# Patient Record
Sex: Female | Born: 1945
Health system: Southern US, Community
[De-identification: ages and names within clinical notes are randomized; demographics above are authoritative.]

## PROBLEM LIST (undated history)

## (undated) DIAGNOSIS — J449 Chronic obstructive pulmonary disease, unspecified: Secondary | ICD-10-CM

## (undated) DIAGNOSIS — I10 Essential (primary) hypertension: Secondary | ICD-10-CM

## (undated) DIAGNOSIS — M1990 Unspecified osteoarthritis, unspecified site: Secondary | ICD-10-CM

## (undated) DIAGNOSIS — J45909 Unspecified asthma, uncomplicated: Secondary | ICD-10-CM

## (undated) HISTORY — PX: ABDOMINAL HYSTERECTOMY: SHX81

## (undated) HISTORY — PX: REPLACEMENT TOTAL KNEE: SUR1224

---

## 2014-05-15 DIAGNOSIS — M19011 Primary osteoarthritis, right shoulder: Secondary | ICD-10-CM | POA: Diagnosis not present

## 2014-05-30 DIAGNOSIS — Z1231 Encounter for screening mammogram for malignant neoplasm of breast: Secondary | ICD-10-CM | POA: Diagnosis not present

## 2014-06-13 DIAGNOSIS — J449 Chronic obstructive pulmonary disease, unspecified: Secondary | ICD-10-CM | POA: Diagnosis not present

## 2014-06-13 DIAGNOSIS — Z6825 Body mass index (BMI) 25.0-25.9, adult: Secondary | ICD-10-CM | POA: Diagnosis not present

## 2014-06-13 DIAGNOSIS — J208 Acute bronchitis due to other specified organisms: Secondary | ICD-10-CM | POA: Diagnosis not present

## 2014-06-13 DIAGNOSIS — I10 Essential (primary) hypertension: Secondary | ICD-10-CM | POA: Diagnosis not present

## 2014-06-13 DIAGNOSIS — J019 Acute sinusitis, unspecified: Secondary | ICD-10-CM | POA: Diagnosis not present

## 2014-06-23 DIAGNOSIS — Z9181 History of falling: Secondary | ICD-10-CM | POA: Diagnosis not present

## 2014-06-23 DIAGNOSIS — Z1389 Encounter for screening for other disorder: Secondary | ICD-10-CM | POA: Diagnosis not present

## 2014-06-23 DIAGNOSIS — I471 Supraventricular tachycardia: Secondary | ICD-10-CM | POA: Diagnosis not present

## 2014-06-23 DIAGNOSIS — J449 Chronic obstructive pulmonary disease, unspecified: Secondary | ICD-10-CM | POA: Diagnosis not present

## 2014-06-23 DIAGNOSIS — M19011 Primary osteoarthritis, right shoulder: Secondary | ICD-10-CM | POA: Diagnosis not present

## 2014-06-23 DIAGNOSIS — I10 Essential (primary) hypertension: Secondary | ICD-10-CM | POA: Diagnosis not present

## 2014-06-26 DIAGNOSIS — Z01818 Encounter for other preprocedural examination: Secondary | ICD-10-CM | POA: Diagnosis not present

## 2014-06-26 DIAGNOSIS — M79609 Pain in unspecified limb: Secondary | ICD-10-CM | POA: Diagnosis not present

## 2014-06-26 DIAGNOSIS — Z79899 Other long term (current) drug therapy: Secondary | ICD-10-CM | POA: Diagnosis not present

## 2014-07-03 DIAGNOSIS — M19011 Primary osteoarthritis, right shoulder: Secondary | ICD-10-CM | POA: Diagnosis not present

## 2014-07-13 DIAGNOSIS — M25511 Pain in right shoulder: Secondary | ICD-10-CM | POA: Diagnosis not present

## 2014-07-13 DIAGNOSIS — M879 Osteonecrosis, unspecified: Secondary | ICD-10-CM | POA: Diagnosis not present

## 2014-07-13 DIAGNOSIS — M19011 Primary osteoarthritis, right shoulder: Secondary | ICD-10-CM | POA: Diagnosis not present

## 2014-07-27 DIAGNOSIS — Z880 Allergy status to penicillin: Secondary | ICD-10-CM | POA: Diagnosis not present

## 2014-07-27 DIAGNOSIS — Z87311 Personal history of (healed) other pathological fracture: Secondary | ICD-10-CM | POA: Diagnosis not present

## 2014-07-27 DIAGNOSIS — Z8679 Personal history of other diseases of the circulatory system: Secondary | ICD-10-CM | POA: Diagnosis not present

## 2014-07-27 DIAGNOSIS — Z8261 Family history of arthritis: Secondary | ICD-10-CM | POA: Diagnosis not present

## 2014-07-27 DIAGNOSIS — Z91013 Allergy to seafood: Secondary | ICD-10-CM | POA: Diagnosis not present

## 2014-07-27 DIAGNOSIS — Z79899 Other long term (current) drug therapy: Secondary | ICD-10-CM | POA: Diagnosis not present

## 2014-07-27 DIAGNOSIS — M8580 Other specified disorders of bone density and structure, unspecified site: Secondary | ICD-10-CM | POA: Diagnosis not present

## 2014-07-27 DIAGNOSIS — F419 Anxiety disorder, unspecified: Secondary | ICD-10-CM | POA: Diagnosis not present

## 2014-07-27 DIAGNOSIS — G8918 Other acute postprocedural pain: Secondary | ICD-10-CM | POA: Diagnosis not present

## 2014-07-27 DIAGNOSIS — Z96652 Presence of left artificial knee joint: Secondary | ICD-10-CM | POA: Diagnosis not present

## 2014-07-27 DIAGNOSIS — Z96611 Presence of right artificial shoulder joint: Secondary | ICD-10-CM | POA: Diagnosis not present

## 2014-07-27 DIAGNOSIS — E780 Pure hypercholesterolemia: Secondary | ICD-10-CM | POA: Diagnosis not present

## 2014-07-27 DIAGNOSIS — D649 Anemia, unspecified: Secondary | ICD-10-CM | POA: Diagnosis not present

## 2014-07-27 DIAGNOSIS — Z87442 Personal history of urinary calculi: Secondary | ICD-10-CM | POA: Diagnosis not present

## 2014-07-27 DIAGNOSIS — M19011 Primary osteoarthritis, right shoulder: Secondary | ICD-10-CM | POA: Diagnosis not present

## 2014-07-27 DIAGNOSIS — Z472 Encounter for removal of internal fixation device: Secondary | ICD-10-CM | POA: Diagnosis not present

## 2014-07-27 DIAGNOSIS — Z87898 Personal history of other specified conditions: Secondary | ICD-10-CM | POA: Diagnosis not present

## 2014-07-27 DIAGNOSIS — Z471 Aftercare following joint replacement surgery: Secondary | ICD-10-CM | POA: Diagnosis not present

## 2014-07-27 DIAGNOSIS — Z809 Family history of malignant neoplasm, unspecified: Secondary | ICD-10-CM | POA: Diagnosis not present

## 2014-07-27 DIAGNOSIS — Z885 Allergy status to narcotic agent status: Secondary | ICD-10-CM | POA: Diagnosis not present

## 2014-07-27 DIAGNOSIS — Z882 Allergy status to sulfonamides status: Secondary | ICD-10-CM | POA: Diagnosis not present

## 2014-07-27 DIAGNOSIS — Z886 Allergy status to analgesic agent status: Secondary | ICD-10-CM | POA: Diagnosis not present

## 2014-07-27 DIAGNOSIS — Z90710 Acquired absence of both cervix and uterus: Secondary | ICD-10-CM | POA: Diagnosis not present

## 2014-07-27 DIAGNOSIS — J45909 Unspecified asthma, uncomplicated: Secondary | ICD-10-CM | POA: Diagnosis not present

## 2014-07-27 DIAGNOSIS — K219 Gastro-esophageal reflux disease without esophagitis: Secondary | ICD-10-CM | POA: Diagnosis not present

## 2014-07-27 DIAGNOSIS — J449 Chronic obstructive pulmonary disease, unspecified: Secondary | ICD-10-CM | POA: Diagnosis not present

## 2014-07-27 DIAGNOSIS — F329 Major depressive disorder, single episode, unspecified: Secondary | ICD-10-CM | POA: Diagnosis not present

## 2014-07-27 DIAGNOSIS — Z9889 Other specified postprocedural states: Secondary | ICD-10-CM | POA: Diagnosis not present

## 2014-07-27 DIAGNOSIS — Z960 Presence of urogenital implants: Secondary | ICD-10-CM | POA: Diagnosis not present

## 2014-07-30 DIAGNOSIS — Z471 Aftercare following joint replacement surgery: Secondary | ICD-10-CM | POA: Diagnosis not present

## 2014-07-30 DIAGNOSIS — M25511 Pain in right shoulder: Secondary | ICD-10-CM | POA: Diagnosis not present

## 2014-07-30 DIAGNOSIS — Z96611 Presence of right artificial shoulder joint: Secondary | ICD-10-CM | POA: Diagnosis not present

## 2014-08-01 DIAGNOSIS — Z96611 Presence of right artificial shoulder joint: Secondary | ICD-10-CM | POA: Diagnosis not present

## 2014-08-01 DIAGNOSIS — Z471 Aftercare following joint replacement surgery: Secondary | ICD-10-CM | POA: Diagnosis not present

## 2014-08-01 DIAGNOSIS — M25511 Pain in right shoulder: Secondary | ICD-10-CM | POA: Diagnosis not present

## 2014-08-03 DIAGNOSIS — Z471 Aftercare following joint replacement surgery: Secondary | ICD-10-CM | POA: Diagnosis not present

## 2014-08-03 DIAGNOSIS — M25511 Pain in right shoulder: Secondary | ICD-10-CM | POA: Diagnosis not present

## 2014-08-03 DIAGNOSIS — Z96611 Presence of right artificial shoulder joint: Secondary | ICD-10-CM | POA: Diagnosis not present

## 2014-08-07 DIAGNOSIS — Z96611 Presence of right artificial shoulder joint: Secondary | ICD-10-CM | POA: Diagnosis not present

## 2014-08-07 DIAGNOSIS — M25511 Pain in right shoulder: Secondary | ICD-10-CM | POA: Diagnosis not present

## 2014-08-07 DIAGNOSIS — Z471 Aftercare following joint replacement surgery: Secondary | ICD-10-CM | POA: Diagnosis not present

## 2014-08-09 DIAGNOSIS — Z471 Aftercare following joint replacement surgery: Secondary | ICD-10-CM | POA: Diagnosis not present

## 2014-08-09 DIAGNOSIS — M25511 Pain in right shoulder: Secondary | ICD-10-CM | POA: Diagnosis not present

## 2014-08-09 DIAGNOSIS — Z96611 Presence of right artificial shoulder joint: Secondary | ICD-10-CM | POA: Diagnosis not present

## 2014-08-11 DIAGNOSIS — M25511 Pain in right shoulder: Secondary | ICD-10-CM | POA: Diagnosis not present

## 2014-08-11 DIAGNOSIS — I10 Essential (primary) hypertension: Secondary | ICD-10-CM | POA: Diagnosis not present

## 2014-08-11 DIAGNOSIS — Z96611 Presence of right artificial shoulder joint: Secondary | ICD-10-CM | POA: Diagnosis not present

## 2014-08-11 DIAGNOSIS — D62 Acute posthemorrhagic anemia: Secondary | ICD-10-CM | POA: Diagnosis not present

## 2014-08-11 DIAGNOSIS — Z6825 Body mass index (BMI) 25.0-25.9, adult: Secondary | ICD-10-CM | POA: Diagnosis not present

## 2014-08-11 DIAGNOSIS — Z471 Aftercare following joint replacement surgery: Secondary | ICD-10-CM | POA: Diagnosis not present

## 2014-08-18 DIAGNOSIS — Z96611 Presence of right artificial shoulder joint: Secondary | ICD-10-CM | POA: Diagnosis not present

## 2014-08-18 DIAGNOSIS — M25511 Pain in right shoulder: Secondary | ICD-10-CM | POA: Diagnosis not present

## 2014-08-18 DIAGNOSIS — M25611 Stiffness of right shoulder, not elsewhere classified: Secondary | ICD-10-CM | POA: Diagnosis not present

## 2014-08-18 DIAGNOSIS — M6281 Muscle weakness (generalized): Secondary | ICD-10-CM | POA: Diagnosis not present

## 2014-08-22 DIAGNOSIS — M25511 Pain in right shoulder: Secondary | ICD-10-CM | POA: Diagnosis not present

## 2014-08-22 DIAGNOSIS — M6281 Muscle weakness (generalized): Secondary | ICD-10-CM | POA: Diagnosis not present

## 2014-08-22 DIAGNOSIS — Z96611 Presence of right artificial shoulder joint: Secondary | ICD-10-CM | POA: Diagnosis not present

## 2014-08-22 DIAGNOSIS — M25611 Stiffness of right shoulder, not elsewhere classified: Secondary | ICD-10-CM | POA: Diagnosis not present

## 2014-08-24 DIAGNOSIS — M25611 Stiffness of right shoulder, not elsewhere classified: Secondary | ICD-10-CM | POA: Diagnosis not present

## 2014-08-24 DIAGNOSIS — M25511 Pain in right shoulder: Secondary | ICD-10-CM | POA: Diagnosis not present

## 2014-08-24 DIAGNOSIS — M6281 Muscle weakness (generalized): Secondary | ICD-10-CM | POA: Diagnosis not present

## 2014-08-24 DIAGNOSIS — Z96611 Presence of right artificial shoulder joint: Secondary | ICD-10-CM | POA: Diagnosis not present

## 2014-08-25 DIAGNOSIS — Z6825 Body mass index (BMI) 25.0-25.9, adult: Secondary | ICD-10-CM | POA: Diagnosis not present

## 2014-08-25 DIAGNOSIS — Z800 Family history of malignant neoplasm of digestive organs: Secondary | ICD-10-CM | POA: Diagnosis not present

## 2014-08-25 DIAGNOSIS — I10 Essential (primary) hypertension: Secondary | ICD-10-CM | POA: Diagnosis not present

## 2014-08-29 DIAGNOSIS — Z96611 Presence of right artificial shoulder joint: Secondary | ICD-10-CM | POA: Diagnosis not present

## 2014-08-29 DIAGNOSIS — M6281 Muscle weakness (generalized): Secondary | ICD-10-CM | POA: Diagnosis not present

## 2014-08-29 DIAGNOSIS — M25611 Stiffness of right shoulder, not elsewhere classified: Secondary | ICD-10-CM | POA: Diagnosis not present

## 2014-08-29 DIAGNOSIS — M25511 Pain in right shoulder: Secondary | ICD-10-CM | POA: Diagnosis not present

## 2014-08-31 DIAGNOSIS — M25611 Stiffness of right shoulder, not elsewhere classified: Secondary | ICD-10-CM | POA: Diagnosis not present

## 2014-08-31 DIAGNOSIS — M6281 Muscle weakness (generalized): Secondary | ICD-10-CM | POA: Diagnosis not present

## 2014-08-31 DIAGNOSIS — Z96611 Presence of right artificial shoulder joint: Secondary | ICD-10-CM | POA: Diagnosis not present

## 2014-08-31 DIAGNOSIS — M25511 Pain in right shoulder: Secondary | ICD-10-CM | POA: Diagnosis not present

## 2014-09-05 DIAGNOSIS — Z96611 Presence of right artificial shoulder joint: Secondary | ICD-10-CM | POA: Diagnosis not present

## 2014-09-05 DIAGNOSIS — M25611 Stiffness of right shoulder, not elsewhere classified: Secondary | ICD-10-CM | POA: Diagnosis not present

## 2014-09-05 DIAGNOSIS — M6281 Muscle weakness (generalized): Secondary | ICD-10-CM | POA: Diagnosis not present

## 2014-09-05 DIAGNOSIS — M25511 Pain in right shoulder: Secondary | ICD-10-CM | POA: Diagnosis not present

## 2014-09-07 DIAGNOSIS — M6281 Muscle weakness (generalized): Secondary | ICD-10-CM | POA: Diagnosis not present

## 2014-09-07 DIAGNOSIS — Z96611 Presence of right artificial shoulder joint: Secondary | ICD-10-CM | POA: Diagnosis not present

## 2014-09-07 DIAGNOSIS — M25611 Stiffness of right shoulder, not elsewhere classified: Secondary | ICD-10-CM | POA: Diagnosis not present

## 2014-09-07 DIAGNOSIS — M25511 Pain in right shoulder: Secondary | ICD-10-CM | POA: Diagnosis not present

## 2014-09-08 DIAGNOSIS — F419 Anxiety disorder, unspecified: Secondary | ICD-10-CM | POA: Diagnosis not present

## 2014-09-08 DIAGNOSIS — I10 Essential (primary) hypertension: Secondary | ICD-10-CM | POA: Diagnosis not present

## 2014-09-08 DIAGNOSIS — Z6825 Body mass index (BMI) 25.0-25.9, adult: Secondary | ICD-10-CM | POA: Diagnosis not present

## 2014-09-13 DIAGNOSIS — M25511 Pain in right shoulder: Secondary | ICD-10-CM | POA: Diagnosis not present

## 2014-09-13 DIAGNOSIS — M6281 Muscle weakness (generalized): Secondary | ICD-10-CM | POA: Diagnosis not present

## 2014-09-13 DIAGNOSIS — M25611 Stiffness of right shoulder, not elsewhere classified: Secondary | ICD-10-CM | POA: Diagnosis not present

## 2014-09-13 DIAGNOSIS — Z96611 Presence of right artificial shoulder joint: Secondary | ICD-10-CM | POA: Diagnosis not present

## 2014-09-15 DIAGNOSIS — M6281 Muscle weakness (generalized): Secondary | ICD-10-CM | POA: Diagnosis not present

## 2014-09-15 DIAGNOSIS — M25511 Pain in right shoulder: Secondary | ICD-10-CM | POA: Diagnosis not present

## 2014-09-15 DIAGNOSIS — M25611 Stiffness of right shoulder, not elsewhere classified: Secondary | ICD-10-CM | POA: Diagnosis not present

## 2014-09-15 DIAGNOSIS — Z96611 Presence of right artificial shoulder joint: Secondary | ICD-10-CM | POA: Diagnosis not present

## 2014-09-26 DIAGNOSIS — M6281 Muscle weakness (generalized): Secondary | ICD-10-CM | POA: Diagnosis not present

## 2014-09-26 DIAGNOSIS — M25511 Pain in right shoulder: Secondary | ICD-10-CM | POA: Diagnosis not present

## 2014-09-26 DIAGNOSIS — Z96611 Presence of right artificial shoulder joint: Secondary | ICD-10-CM | POA: Diagnosis not present

## 2014-09-26 DIAGNOSIS — M25611 Stiffness of right shoulder, not elsewhere classified: Secondary | ICD-10-CM | POA: Diagnosis not present

## 2014-10-03 DIAGNOSIS — Z96611 Presence of right artificial shoulder joint: Secondary | ICD-10-CM | POA: Diagnosis not present

## 2014-10-03 DIAGNOSIS — M25611 Stiffness of right shoulder, not elsewhere classified: Secondary | ICD-10-CM | POA: Diagnosis not present

## 2014-10-03 DIAGNOSIS — M25511 Pain in right shoulder: Secondary | ICD-10-CM | POA: Diagnosis not present

## 2014-10-03 DIAGNOSIS — M6281 Muscle weakness (generalized): Secondary | ICD-10-CM | POA: Diagnosis not present

## 2014-10-06 DIAGNOSIS — Z9181 History of falling: Secondary | ICD-10-CM | POA: Diagnosis not present

## 2014-10-06 DIAGNOSIS — Z6824 Body mass index (BMI) 24.0-24.9, adult: Secondary | ICD-10-CM | POA: Diagnosis not present

## 2014-10-06 DIAGNOSIS — I10 Essential (primary) hypertension: Secondary | ICD-10-CM | POA: Diagnosis not present

## 2014-10-06 DIAGNOSIS — Z1389 Encounter for screening for other disorder: Secondary | ICD-10-CM | POA: Diagnosis not present

## 2014-10-06 DIAGNOSIS — Z139 Encounter for screening, unspecified: Secondary | ICD-10-CM | POA: Diagnosis not present

## 2014-11-01 DIAGNOSIS — Z6824 Body mass index (BMI) 24.0-24.9, adult: Secondary | ICD-10-CM | POA: Diagnosis not present

## 2014-11-01 DIAGNOSIS — J019 Acute sinusitis, unspecified: Secondary | ICD-10-CM | POA: Diagnosis not present

## 2015-01-12 DIAGNOSIS — D62 Acute posthemorrhagic anemia: Secondary | ICD-10-CM | POA: Diagnosis not present

## 2015-01-12 DIAGNOSIS — Z1389 Encounter for screening for other disorder: Secondary | ICD-10-CM | POA: Diagnosis not present

## 2015-01-12 DIAGNOSIS — I10 Essential (primary) hypertension: Secondary | ICD-10-CM | POA: Diagnosis not present

## 2015-01-12 DIAGNOSIS — J449 Chronic obstructive pulmonary disease, unspecified: Secondary | ICD-10-CM | POA: Diagnosis not present

## 2015-01-12 DIAGNOSIS — E785 Hyperlipidemia, unspecified: Secondary | ICD-10-CM | POA: Diagnosis not present

## 2015-01-31 DIAGNOSIS — J019 Acute sinusitis, unspecified: Secondary | ICD-10-CM | POA: Diagnosis not present

## 2015-01-31 DIAGNOSIS — R110 Nausea: Secondary | ICD-10-CM | POA: Diagnosis not present

## 2015-01-31 DIAGNOSIS — H8113 Benign paroxysmal vertigo, bilateral: Secondary | ICD-10-CM | POA: Diagnosis not present

## 2015-02-02 DIAGNOSIS — M810 Age-related osteoporosis without current pathological fracture: Secondary | ICD-10-CM | POA: Diagnosis not present

## 2015-02-06 DIAGNOSIS — Z96611 Presence of right artificial shoulder joint: Secondary | ICD-10-CM | POA: Diagnosis not present

## 2015-03-06 DIAGNOSIS — M7612 Psoas tendinitis, left hip: Secondary | ICD-10-CM | POA: Diagnosis not present

## 2015-03-09 DIAGNOSIS — M7612 Psoas tendinitis, left hip: Secondary | ICD-10-CM | POA: Diagnosis not present

## 2015-03-09 DIAGNOSIS — M25552 Pain in left hip: Secondary | ICD-10-CM | POA: Diagnosis not present

## 2015-03-13 DIAGNOSIS — M25552 Pain in left hip: Secondary | ICD-10-CM | POA: Diagnosis not present

## 2015-03-13 DIAGNOSIS — M7612 Psoas tendinitis, left hip: Secondary | ICD-10-CM | POA: Diagnosis not present

## 2015-03-16 DIAGNOSIS — M7612 Psoas tendinitis, left hip: Secondary | ICD-10-CM | POA: Diagnosis not present

## 2015-03-16 DIAGNOSIS — M25552 Pain in left hip: Secondary | ICD-10-CM | POA: Diagnosis not present

## 2015-03-20 DIAGNOSIS — M7612 Psoas tendinitis, left hip: Secondary | ICD-10-CM | POA: Diagnosis not present

## 2015-03-20 DIAGNOSIS — M25552 Pain in left hip: Secondary | ICD-10-CM | POA: Diagnosis not present

## 2015-05-09 DIAGNOSIS — J019 Acute sinusitis, unspecified: Secondary | ICD-10-CM | POA: Diagnosis not present

## 2015-05-09 DIAGNOSIS — Z6825 Body mass index (BMI) 25.0-25.9, adult: Secondary | ICD-10-CM | POA: Diagnosis not present

## 2015-05-30 DIAGNOSIS — J208 Acute bronchitis due to other specified organisms: Secondary | ICD-10-CM | POA: Diagnosis not present

## 2015-06-04 DIAGNOSIS — J208 Acute bronchitis due to other specified organisms: Secondary | ICD-10-CM | POA: Diagnosis not present

## 2015-07-20 DIAGNOSIS — J019 Acute sinusitis, unspecified: Secondary | ICD-10-CM | POA: Diagnosis not present

## 2015-07-20 DIAGNOSIS — J449 Chronic obstructive pulmonary disease, unspecified: Secondary | ICD-10-CM | POA: Diagnosis not present

## 2015-07-20 DIAGNOSIS — I10 Essential (primary) hypertension: Secondary | ICD-10-CM | POA: Diagnosis not present

## 2015-07-20 DIAGNOSIS — E785 Hyperlipidemia, unspecified: Secondary | ICD-10-CM | POA: Diagnosis not present

## 2015-07-20 DIAGNOSIS — D62 Acute posthemorrhagic anemia: Secondary | ICD-10-CM | POA: Diagnosis not present

## 2015-07-27 DIAGNOSIS — Z1231 Encounter for screening mammogram for malignant neoplasm of breast: Secondary | ICD-10-CM | POA: Diagnosis not present

## 2015-11-07 DIAGNOSIS — E663 Overweight: Secondary | ICD-10-CM | POA: Diagnosis not present

## 2015-11-07 DIAGNOSIS — J019 Acute sinusitis, unspecified: Secondary | ICD-10-CM | POA: Diagnosis not present

## 2015-11-07 DIAGNOSIS — J208 Acute bronchitis due to other specified organisms: Secondary | ICD-10-CM | POA: Diagnosis not present

## 2015-11-07 DIAGNOSIS — H8110 Benign paroxysmal vertigo, unspecified ear: Secondary | ICD-10-CM | POA: Diagnosis not present

## 2015-11-15 DIAGNOSIS — E860 Dehydration: Secondary | ICD-10-CM | POA: Diagnosis not present

## 2015-11-15 DIAGNOSIS — R531 Weakness: Secondary | ICD-10-CM | POA: Diagnosis not present

## 2015-11-15 DIAGNOSIS — Z6824 Body mass index (BMI) 24.0-24.9, adult: Secondary | ICD-10-CM | POA: Diagnosis not present

## 2015-11-15 DIAGNOSIS — I471 Supraventricular tachycardia: Secondary | ICD-10-CM | POA: Diagnosis not present

## 2015-12-12 DIAGNOSIS — M5416 Radiculopathy, lumbar region: Secondary | ICD-10-CM | POA: Diagnosis not present

## 2015-12-14 DIAGNOSIS — M4806 Spinal stenosis, lumbar region: Secondary | ICD-10-CM | POA: Diagnosis not present

## 2015-12-14 DIAGNOSIS — M5416 Radiculopathy, lumbar region: Secondary | ICD-10-CM | POA: Diagnosis not present

## 2015-12-19 DIAGNOSIS — M5416 Radiculopathy, lumbar region: Secondary | ICD-10-CM | POA: Diagnosis not present

## 2015-12-21 DIAGNOSIS — M5136 Other intervertebral disc degeneration, lumbar region: Secondary | ICD-10-CM | POA: Diagnosis not present

## 2015-12-21 DIAGNOSIS — M5416 Radiculopathy, lumbar region: Secondary | ICD-10-CM | POA: Diagnosis not present

## 2016-01-28 DIAGNOSIS — J019 Acute sinusitis, unspecified: Secondary | ICD-10-CM | POA: Diagnosis not present

## 2016-02-07 DIAGNOSIS — Z9181 History of falling: Secondary | ICD-10-CM | POA: Diagnosis not present

## 2016-02-07 DIAGNOSIS — D62 Acute posthemorrhagic anemia: Secondary | ICD-10-CM | POA: Diagnosis not present

## 2016-02-07 DIAGNOSIS — Z1389 Encounter for screening for other disorder: Secondary | ICD-10-CM | POA: Diagnosis not present

## 2016-02-07 DIAGNOSIS — I10 Essential (primary) hypertension: Secondary | ICD-10-CM | POA: Diagnosis not present

## 2016-02-07 DIAGNOSIS — J449 Chronic obstructive pulmonary disease, unspecified: Secondary | ICD-10-CM | POA: Diagnosis not present

## 2016-02-07 DIAGNOSIS — E785 Hyperlipidemia, unspecified: Secondary | ICD-10-CM | POA: Diagnosis not present

## 2016-02-13 DIAGNOSIS — M79671 Pain in right foot: Secondary | ICD-10-CM | POA: Diagnosis not present

## 2016-02-13 DIAGNOSIS — M84374A Stress fracture, right foot, initial encounter for fracture: Secondary | ICD-10-CM | POA: Diagnosis not present

## 2016-03-11 DIAGNOSIS — Z23 Encounter for immunization: Secondary | ICD-10-CM | POA: Diagnosis not present

## 2016-03-19 DIAGNOSIS — M84374D Stress fracture, right foot, subsequent encounter for fracture with routine healing: Secondary | ICD-10-CM | POA: Diagnosis not present

## 2016-04-03 ENCOUNTER — Encounter (HOSPITAL_COMMUNITY): Payer: Self-pay | Admitting: Pharmacy Technician

## 2016-04-03 DIAGNOSIS — Z5329 Procedure and treatment not carried out because of patient's decision for other reasons: Secondary | ICD-10-CM | POA: Diagnosis not present

## 2016-04-03 DIAGNOSIS — Z96659 Presence of unspecified artificial knee joint: Secondary | ICD-10-CM | POA: Diagnosis not present

## 2016-04-03 DIAGNOSIS — J449 Chronic obstructive pulmonary disease, unspecified: Secondary | ICD-10-CM | POA: Insufficient documentation

## 2016-04-03 DIAGNOSIS — I10 Essential (primary) hypertension: Secondary | ICD-10-CM | POA: Diagnosis not present

## 2016-04-03 DIAGNOSIS — M5442 Lumbago with sciatica, left side: Secondary | ICD-10-CM | POA: Diagnosis not present

## 2016-04-03 DIAGNOSIS — M549 Dorsalgia, unspecified: Secondary | ICD-10-CM | POA: Diagnosis not present

## 2016-04-03 DIAGNOSIS — M545 Low back pain: Secondary | ICD-10-CM | POA: Diagnosis present

## 2016-04-03 MED ORDER — OXYCODONE-ACETAMINOPHEN 5-325 MG PO TABS
ORAL_TABLET | ORAL | Status: AC
  Filled 2016-04-03: qty 1

## 2016-04-03 NOTE — ED Triage Notes (Signed)
Pt presents to the ED with c/o back pain/ Pt with a hx of a bulging disc which she gets injections for. Pt reports she has tried tramadol with no relief and the pain is "unbearable". Pt worried she has ruptured disc. Pt also with numbness down her L leg X1 week and has worsened since.

## 2016-04-04 ENCOUNTER — Emergency Department (HOSPITAL_COMMUNITY)
Admission: EM | Admit: 2016-04-04 | Discharge: 2016-04-04 | Disposition: A | Discharge status: Home / Self Care | Payer: Medicare Other | Attending: Emergency Medicine | Admitting: Emergency Medicine

## 2016-04-04 ENCOUNTER — Emergency Department (HOSPITAL_COMMUNITY): Payer: Medicare Other

## 2016-04-04 DIAGNOSIS — M5432 Sciatica, left side: Secondary | ICD-10-CM

## 2016-04-04 DIAGNOSIS — M545 Low back pain: Secondary | ICD-10-CM | POA: Diagnosis not present

## 2016-04-04 HISTORY — DX: Chronic obstructive pulmonary disease, unspecified: J44.9

## 2016-04-04 HISTORY — DX: Essential (primary) hypertension: I10

## 2016-04-04 HISTORY — DX: Unspecified asthma, uncomplicated: J45.909

## 2016-04-04 HISTORY — DX: Unspecified osteoarthritis, unspecified site: M19.90

## 2016-04-04 LAB — CBC WITH DIFFERENTIAL/PLATELET
BASOS ABS: 0 10*3/uL (ref 0.0–0.1)
BASOS PCT: 0 %
EOS ABS: 0.2 10*3/uL (ref 0.0–0.7)
EOS PCT: 3 %
HEMATOCRIT: 39.1 % (ref 36.0–46.0)
Hemoglobin: 13.2 g/dL (ref 12.0–15.0)
Lymphocytes Relative: 34 %
Lymphs Abs: 2.3 10*3/uL (ref 0.7–4.0)
MCH: 30.1 pg (ref 26.0–34.0)
MCHC: 33.8 g/dL (ref 30.0–36.0)
MCV: 89.3 fL (ref 78.0–100.0)
MONO ABS: 0.4 10*3/uL (ref 0.1–1.0)
MONOS PCT: 6 %
NEUTROS ABS: 3.8 10*3/uL (ref 1.7–7.7)
Neutrophils Relative %: 57 %
PLATELETS: 248 10*3/uL (ref 150–400)
RBC: 4.38 MIL/uL (ref 3.87–5.11)
RDW: 12.6 % (ref 11.5–15.5)
WBC: 6.7 10*3/uL (ref 4.0–10.5)

## 2016-04-04 LAB — I-STAT CHEM 8, ED
BUN: 10 mg/dL (ref 6–20)
Calcium, Ion: 1.14 mmol/L — ABNORMAL LOW (ref 1.15–1.40)
Chloride: 101 mmol/L (ref 101–111)
Creatinine, Ser: 0.9 mg/dL (ref 0.44–1.00)
Glucose, Bld: 101 mg/dL — ABNORMAL HIGH (ref 65–99)
HEMATOCRIT: 38 % (ref 36.0–46.0)
HEMOGLOBIN: 12.9 g/dL (ref 12.0–15.0)
POTASSIUM: 3.9 mmol/L (ref 3.5–5.1)
SODIUM: 136 mmol/L (ref 135–145)
TCO2: 26 mmol/L (ref 0–100)

## 2016-04-04 MED ORDER — LIDOCAINE 5 % EX PTCH: 1.0000 | MEDICATED_PATCH | Freq: Every day | CUTANEOUS | 0 refills | Status: AC | PRN

## 2016-04-04 MED ORDER — LIDOCAINE 5 % EX PTCH
1.0000 | MEDICATED_PATCH | CUTANEOUS | Status: DC
  Filled 2016-04-04: qty 1

## 2016-04-04 MED ORDER — MORPHINE SULFATE (PF) 4 MG/ML IV SOLN
4.0000 mg | Freq: Once | INTRAVENOUS | Status: AC
  Administered 2016-04-04: 4 mg via INTRAVENOUS
  Filled 2016-04-04: qty 1

## 2016-04-04 MED ORDER — OXYCODONE HCL 5 MG PO TABS: 5.0000 mg | ORAL_TABLET | Freq: Two times a day (BID) | ORAL | 0 refills | Status: AC | PRN

## 2016-04-04 MED ORDER — KETOROLAC TROMETHAMINE 30 MG/ML IJ SOLN
30.0000 mg | Freq: Once | INTRAMUSCULAR | Status: AC
  Administered 2016-04-04: 30 mg via INTRAVENOUS
  Filled 2016-04-04: qty 1

## 2016-04-04 MED ORDER — LIDOCAINE 5 % EX PTCH
1.0000 | MEDICATED_PATCH | Freq: Once | CUTANEOUS | Status: DC
  Administered 2016-04-04: 1 via TRANSDERMAL
  Filled 2016-04-04: qty 1

## 2016-04-04 MED ORDER — MORPHINE SULFATE (PF) 4 MG/ML IV SOLN
6.0000 mg | Freq: Once | INTRAVENOUS | Status: AC
  Administered 2016-04-04: 6 mg via INTRAVENOUS
  Filled 2016-04-04: qty 2

## 2016-04-04 NOTE — ED Notes (Signed)
Pt brought back from MRI via bed

## 2016-04-04 NOTE — ED Provider Notes (Signed)
[facility]EMER[facility]ENCY DEPT Provider Note   CSN: [id number] Arrival date & time: 04/03/16  2015 By signing my name below, I, Bridgette Habermann, attest that this documentation has been prepared under the direction and in the presence of Tomasita Crumble, MD. Electronically Signed: Bridgette Habermann, ED Scribe. 04/04/16. 1:22 AM.  History   Chief Complaint Chief Complaint  Patient presents with  . Back Pain   HPI Comments: Morgan Hunt is a 70 y.o. female with h/o chronic back pain due to a bulging disc who presents to the Emergency Department complaining of acute on chronic 10/10 lower back pain radiating to her BLEs onset one week ago. She also notes she has numbness and paresthesia to her left leg. Denies any recent injury or trauma. Pt states she frequently has pain due to a bulging disc which she usually gets injections for but notes the pain she is experiencing now is "unbearable". Pain is exacerbated with movement and positional changes. She has tried Tramadol with no relief. Pt has an appointment with her orthopedist next week and was unable to get an appointment earlier due to the holidays. Pt denies difficulty urinating, fever, chills, or any other associated symptoms.   Orthopedist: Durrani, S  The history is provided by the patient. No language interpreter was used.    Past Medical History:  Diagnosis Date  . Arthritis   . Asthma   . COPD (chronic obstructive pulmonary disease) (HCC)   . Hypertension     There are no active problems to display for this patient.   Past Surgical History:  Procedure Laterality Date  . ABDOMINAL HYSTERECTOMY    . REPLACEMENT TOTAL KNEE      OB History    No data available       Home Medications    Prior to Admission medications   Medication Sig Start Date End Date Taking? Authorizing Provider  lidocaine (LIDODERM) 5 % Place 1 patch onto the skin daily as needed (severe pain). Remove & Discard patch within 12 hours or as directed by MD 04/04/16   Tomasita Crumble, MD  oxyCODONE (ROXICODONE) 5 MG immediate release tablet Take 1 tablet (5 mg total) by mouth 2 (two) times daily as needed for severe pain. 04/04/16   Tomasita Crumble, MD    Family History No family history on file.  Social History Social History  Substance Use Topics  . Smoking status: Never Smoker  . Smokeless tobacco: Never Used  . Alcohol use Not on file     Allergies   Dilaudid [hydromorphone hcl]; Penicillins; Shrimp [shellfish allergy]; and Sulfa antibiotics   Review of Systems Review of Systems  Constitutional: Negative for chills and fever.  Genitourinary: Negative for difficulty urinating.  Musculoskeletal: Positive for arthralgias, back pain and myalgias.  Neurological: Positive for numbness.  All other systems reviewed and are negative.    Physical Exam Updated Vital Signs BP 168/89   Pulse 75   Temp 99.1 F (37.3 C) (Oral)   Resp 17   Ht 5\' 1"  (1.549 m)   Wt 140 lb (63.5 kg)   SpO2 95%   BMI 26.45 kg/m   Physical Exam  Constitutional: She is oriented to person, place, and time. She appears well-developed and well-nourished. No distress.  HENT:  Head: Normocephalic and atraumatic.  Nose: Nose normal.  Mouth/Throat: Oropharynx is clear and moist. No oropharyngeal exudate.  Eyes: Conjunctivae and EOM are normal. Pupils are equal, round, and reactive to light. No scleral icterus.  Neck: Normal range  of motion. Neck supple. No JVD present. No tracheal deviation present. No thyromegaly present.  Cardiovascular: Normal rate, regular rhythm and normal heart sounds.  Exam reveals no gallop and no friction rub.   No murmur heard. Pulmonary/Chest: Effort normal and breath sounds normal. No respiratory distress. She has no wheezes. She exhibits no tenderness.  Abdominal: Soft. Bowel sounds are normal. She exhibits no distension and no mass. There is no tenderness. There is no rebound and no guarding.  Musculoskeletal: Normal range of motion. She exhibits no  edema.  Decreased sensation in LLE. Positive straight leg raise test on the left.  Lymphadenopathy:    She has no cervical adenopathy.  Neurological: She is alert and oriented to person, place, and time. No cranial nerve deficit. She exhibits normal muscle tone.  Skin: Skin is warm and dry. No rash noted. No erythema. No pallor.  Nursing note and vitals reviewed.    ED Treatments / Results  DIAGNOSTIC STUDIES: Oxygen Saturation is 98% on RA, normal by my interpretation.    COORDINATION OF CARE: 1:22 AM Discussed treatment plan with pt at bedside which includes MRI and pt agreed to plan.  Labs (all labs ordered are listed, but only abnormal results are displayed) Labs Reviewed  I-STAT CHEM 8, ED - Abnormal; Notable for the following:       Result Value   Glucose, Bld 101 (*)    Calcium, Ion 1.14 (*)    All other components within normal limits  CBC WITH DIFFERENTIAL/PLATELET    EKG  EKG Interpretation None       Radiology Mr Lumbar Spine Wo Contrast  Result Date: 04/04/2016 CLINICAL DATA:  70 y/o F; worsening back pain radiating down the left leg. EXAM: MRI LUMBAR SPINE WITHOUT CONTRAST TECHNIQUE: Multiplanar, multisequence MR imaging of the lumbar spine was performed. No intravenous contrast was administered. COMPARISON:  12/14/2015 lumbar MRI. 12/24/2011 CT abdomen and pelvis. FINDINGS: Segmentation:  Standard. Alignment: Normal lumbar lordosis. No significant listhesis. Moderate dextro curvature with apex at L4. Vertebrae: Mild edema within the left aspect of the L5 vertebral body and pedicle probably represents a stress reaction. No discrete fracture is identified. No evidence for discitis. No suspicious osseous lesion. Conus medullaris: Extends to the lower L1 level and appears normal. Paraspinal and other soft tissues: 5 mm gallstone. Disc levels: L1-2: Small right central protrusion. No significant foraminal narrowing or canal stenosis. L2-3: Disc bulge eccentric to the  left foraminal zone and mild facet/ ligamentum flavum hypertrophy. Mild left foraminal and lateral recess narrowing. No significant canal stenosis. L3-4: Small disc bulge and mild facet/ ligamentum flavum hypertrophy. Trace facet effusions. Mild bilateral foraminal and lateral recess narrowing. No significant canal stenosis. L4-5: Moderate disc bulge with small central annular fissure. Moderate facet and ligamentum flavum hypertrophy. Small right facet effusion. Moderate right and mild left foraminal narrowing and canal stenosis with crowding of cauda equina. L5-S1: Moderate disc bulge eccentric to the left foraminal and extra foraminal zone. Mild facet hypertrophy and moderate uncovertebral hypertrophy. Severe left-sided foraminal narrowing and probable impingement of exiting L5 nerve root in the extra foraminal zone. Mild right foraminal narrowing. Mild canal stenosis. Articulation of left lateral process with sacral ala. IMPRESSION: 1. Lumbar spondylosis greatest at the L4-5 and L5-S1 levels is stable in comparison with the prior lumbar spine MRI. 2. At L4-5 there is moderate right and mild left foraminal narrowing and moderate canal stenosis. 3. At L5-S1 there is severe left-sided foraminal narrowing and impingement of exiting L5  nerve root in the extra foraminal zone. Electronically Signed   By: Mitzi HansenLance  Furusawa-Stratton M.D.   On: 04/04/2016 05:23    Procedures Procedures (including critical care time)  Medications Ordered in ED Medications  lidocaine (LIDODERM) 5 % 1 patch (1 patch Transdermal Patch Applied 04/04/16 0142)  morphine 4 MG/ML injection 4 mg (4 mg Intravenous Given 04/04/16 0136)  ketorolac (TORADOL) 30 MG/ML injection 30 mg (30 mg Intravenous Given 04/04/16 0136)  morphine 4 MG/ML injection 6 mg (6 mg Intravenous Given 04/04/16 0332)     Initial Impression / Assessment and Plan / ED Course  I have reviewed the triage vital signs and the nursing notes.  Pertinent labs & imaging  results that were available during my care of the patient were reviewed by me and considered in my medical decision making (see chart for details).  Clinical Course     Patient presents to the emergency department for worsening back pain which now radiates down to her toes which is new for her. This could represent worsening lumbar disease. She has no incontinence that she does have numbness of the leg which concerns me for an acute emergency. Will obtain MRI for further evaluation. She was given morphine and Toradol for pain control. We'll also place a lidocaine patch.  3:17 AM Patient still in pain.  Will redose with dilaudid.  MRI is still pending.  5:40 AM MRI confirms L sided sciatica with pinge nerve root L5.  Patient informed of findings and given nsurg fu.  Her pain is now under good control.  Advised for ice packs, lidocaine patches, tylenol and oxycodone at home as needed for pain.  Back exercises provided as well.  She also has a fu appointment with her ortho doc in 7 days.  She appears well and in NAD. Vs remain within her normal limits and she is safe for DC.  Final Clinical Impressions(s) / ED Diagnoses   Final diagnoses:  Sciatica of left side    New Prescriptions New Prescriptions   LIDOCAINE (LIDODERM) 5 %    Place 1 patch onto the skin daily as needed (severe pain). Remove & Discard patch within 12 hours or as directed by MD   OXYCODONE (ROXICODONE) 5 MG IMMEDIATE RELEASE TABLET    Take 1 tablet (5 mg total) by mouth 2 (two) times daily as needed for severe pain.     I personally performed the services described in this documentation, which was scribed in my presence. The recorded information has been reviewed and is accurate.      Tomasita CrumbleAdeleke Breyana Follansbee, MD 04/04/16 [phone]

## 2016-04-04 NOTE — ED Notes (Signed)
Pt taken to MRI via bed

## 2016-04-11 DIAGNOSIS — M5136 Other intervertebral disc degeneration, lumbar region: Secondary | ICD-10-CM | POA: Diagnosis not present

## 2016-04-11 DIAGNOSIS — M5416 Radiculopathy, lumbar region: Secondary | ICD-10-CM | POA: Diagnosis not present

## 2016-04-23 DIAGNOSIS — M5416 Radiculopathy, lumbar region: Secondary | ICD-10-CM | POA: Diagnosis not present

## 2016-04-24 DIAGNOSIS — R52 Pain, unspecified: Secondary | ICD-10-CM | POA: Diagnosis not present

## 2016-04-24 DIAGNOSIS — Z01818 Encounter for other preprocedural examination: Secondary | ICD-10-CM | POA: Diagnosis not present

## 2016-04-24 DIAGNOSIS — Z01810 Encounter for preprocedural cardiovascular examination: Secondary | ICD-10-CM | POA: Diagnosis not present

## 2016-04-24 DIAGNOSIS — M79609 Pain in unspecified limb: Secondary | ICD-10-CM | POA: Diagnosis not present

## 2016-04-24 DIAGNOSIS — R918 Other nonspecific abnormal finding of lung field: Secondary | ICD-10-CM | POA: Diagnosis not present

## 2016-04-24 DIAGNOSIS — E559 Vitamin D deficiency, unspecified: Secondary | ICD-10-CM | POA: Diagnosis not present

## 2016-04-24 DIAGNOSIS — Z79899 Other long term (current) drug therapy: Secondary | ICD-10-CM | POA: Diagnosis not present

## 2016-05-07 DIAGNOSIS — J449 Chronic obstructive pulmonary disease, unspecified: Secondary | ICD-10-CM | POA: Diagnosis not present

## 2016-05-07 DIAGNOSIS — I10 Essential (primary) hypertension: Secondary | ICD-10-CM | POA: Diagnosis not present

## 2016-05-07 DIAGNOSIS — M5416 Radiculopathy, lumbar region: Secondary | ICD-10-CM | POA: Diagnosis not present

## 2016-05-07 DIAGNOSIS — Z6824 Body mass index (BMI) 24.0-24.9, adult: Secondary | ICD-10-CM | POA: Diagnosis not present

## 2016-05-15 DIAGNOSIS — M79605 Pain in left leg: Secondary | ICD-10-CM | POA: Diagnosis not present

## 2016-05-15 DIAGNOSIS — M5116 Intervertebral disc disorders with radiculopathy, lumbar region: Secondary | ICD-10-CM | POA: Diagnosis not present

## 2016-05-15 DIAGNOSIS — M4807 Spinal stenosis, lumbosacral region: Secondary | ICD-10-CM | POA: Diagnosis not present

## 2016-05-15 DIAGNOSIS — M8580 Other specified disorders of bone density and structure, unspecified site: Secondary | ICD-10-CM | POA: Diagnosis not present

## 2016-05-15 DIAGNOSIS — M4317 Spondylolisthesis, lumbosacral region: Secondary | ICD-10-CM | POA: Diagnosis not present

## 2016-05-15 DIAGNOSIS — M48061 Spinal stenosis, lumbar region without neurogenic claudication: Secondary | ICD-10-CM | POA: Diagnosis not present

## 2016-05-15 DIAGNOSIS — J449 Chronic obstructive pulmonary disease, unspecified: Secondary | ICD-10-CM | POA: Diagnosis not present

## 2016-05-15 DIAGNOSIS — K219 Gastro-esophageal reflux disease without esophagitis: Secondary | ICD-10-CM | POA: Diagnosis not present

## 2016-05-15 DIAGNOSIS — I10 Essential (primary) hypertension: Secondary | ICD-10-CM | POA: Diagnosis not present

## 2016-05-15 DIAGNOSIS — M545 Low back pain: Secondary | ICD-10-CM | POA: Diagnosis not present

## 2016-05-15 DIAGNOSIS — E7800 Pure hypercholesterolemia, unspecified: Secondary | ICD-10-CM | POA: Diagnosis not present

## 2016-05-15 DIAGNOSIS — Z96652 Presence of left artificial knee joint: Secondary | ICD-10-CM | POA: Diagnosis not present

## 2016-05-15 DIAGNOSIS — M4316 Spondylolisthesis, lumbar region: Secondary | ICD-10-CM | POA: Diagnosis not present

## 2016-05-15 DIAGNOSIS — M79604 Pain in right leg: Secondary | ICD-10-CM | POA: Diagnosis not present

## 2016-05-15 DIAGNOSIS — M5117 Intervertebral disc disorders with radiculopathy, lumbosacral region: Secondary | ICD-10-CM | POA: Diagnosis not present

## 2016-05-15 DIAGNOSIS — M48062 Spinal stenosis, lumbar region with neurogenic claudication: Secondary | ICD-10-CM | POA: Diagnosis not present

## 2016-05-15 DIAGNOSIS — R262 Difficulty in walking, not elsewhere classified: Secondary | ICD-10-CM | POA: Diagnosis not present

## 2016-05-15 DIAGNOSIS — M5416 Radiculopathy, lumbar region: Secondary | ICD-10-CM | POA: Diagnosis not present

## 2016-05-15 DIAGNOSIS — Z79899 Other long term (current) drug therapy: Secondary | ICD-10-CM | POA: Diagnosis not present

## 2016-05-31 DIAGNOSIS — M545 Low back pain: Secondary | ICD-10-CM | POA: Diagnosis not present

## 2016-05-31 DIAGNOSIS — S42225A 2-part nondisplaced fracture of surgical neck of left humerus, initial encounter for closed fracture: Secondary | ICD-10-CM | POA: Diagnosis not present

## 2016-05-31 DIAGNOSIS — S42215A Unspecified nondisplaced fracture of surgical neck of left humerus, initial encounter for closed fracture: Secondary | ICD-10-CM | POA: Diagnosis not present

## 2016-05-31 DIAGNOSIS — S42212A Unspecified displaced fracture of surgical neck of left humerus, initial encounter for closed fracture: Secondary | ICD-10-CM | POA: Diagnosis not present

## 2016-05-31 DIAGNOSIS — S42292A Other displaced fracture of upper end of left humerus, initial encounter for closed fracture: Secondary | ICD-10-CM | POA: Diagnosis not present

## 2016-06-03 DIAGNOSIS — S52612A Displaced fracture of left ulna styloid process, initial encounter for closed fracture: Secondary | ICD-10-CM | POA: Diagnosis not present

## 2016-06-03 DIAGNOSIS — S42202A Unspecified fracture of upper end of left humerus, initial encounter for closed fracture: Secondary | ICD-10-CM | POA: Diagnosis not present

## 2016-06-12 DIAGNOSIS — S52612A Displaced fracture of left ulna styloid process, initial encounter for closed fracture: Secondary | ICD-10-CM | POA: Diagnosis not present

## 2016-06-12 DIAGNOSIS — S42202A Unspecified fracture of upper end of left humerus, initial encounter for closed fracture: Secondary | ICD-10-CM | POA: Diagnosis not present

## 2016-06-22 DIAGNOSIS — H66001 Acute suppurative otitis media without spontaneous rupture of ear drum, right ear: Secondary | ICD-10-CM | POA: Diagnosis not present

## 2016-06-30 DIAGNOSIS — J208 Acute bronchitis due to other specified organisms: Secondary | ICD-10-CM | POA: Diagnosis not present

## 2016-06-30 DIAGNOSIS — Z6823 Body mass index (BMI) 23.0-23.9, adult: Secondary | ICD-10-CM | POA: Diagnosis not present

## 2016-06-30 DIAGNOSIS — S52612A Displaced fracture of left ulna styloid process, initial encounter for closed fracture: Secondary | ICD-10-CM | POA: Diagnosis not present

## 2016-06-30 DIAGNOSIS — S42202A Unspecified fracture of upper end of left humerus, initial encounter for closed fracture: Secondary | ICD-10-CM | POA: Diagnosis not present

## 2016-07-11 DIAGNOSIS — M5416 Radiculopathy, lumbar region: Secondary | ICD-10-CM | POA: Diagnosis not present

## 2016-07-24 DIAGNOSIS — S42202A Unspecified fracture of upper end of left humerus, initial encounter for closed fracture: Secondary | ICD-10-CM | POA: Diagnosis not present

## 2016-07-24 DIAGNOSIS — S52612A Displaced fracture of left ulna styloid process, initial encounter for closed fracture: Secondary | ICD-10-CM | POA: Diagnosis not present

## 2016-08-07 DIAGNOSIS — I10 Essential (primary) hypertension: Secondary | ICD-10-CM | POA: Diagnosis not present

## 2016-08-07 DIAGNOSIS — E785 Hyperlipidemia, unspecified: Secondary | ICD-10-CM | POA: Diagnosis not present

## 2016-08-07 DIAGNOSIS — J302 Other seasonal allergic rhinitis: Secondary | ICD-10-CM | POA: Diagnosis not present

## 2016-08-07 DIAGNOSIS — J449 Chronic obstructive pulmonary disease, unspecified: Secondary | ICD-10-CM | POA: Diagnosis not present

## 2016-08-07 DIAGNOSIS — D62 Acute posthemorrhagic anemia: Secondary | ICD-10-CM | POA: Diagnosis not present

## 2016-08-27 DIAGNOSIS — Z1231 Encounter for screening mammogram for malignant neoplasm of breast: Secondary | ICD-10-CM | POA: Diagnosis not present

## 2016-09-03 DIAGNOSIS — S42202A Unspecified fracture of upper end of left humerus, initial encounter for closed fracture: Secondary | ICD-10-CM | POA: Diagnosis not present

## 2016-10-14 DIAGNOSIS — M5416 Radiculopathy, lumbar region: Secondary | ICD-10-CM | POA: Diagnosis not present

## 2016-10-31 DIAGNOSIS — R0600 Dyspnea, unspecified: Secondary | ICD-10-CM | POA: Diagnosis not present

## 2016-10-31 DIAGNOSIS — M25471 Effusion, right ankle: Secondary | ICD-10-CM | POA: Diagnosis not present

## 2016-12-02 DIAGNOSIS — N959 Unspecified menopausal and perimenopausal disorder: Secondary | ICD-10-CM | POA: Diagnosis not present

## 2016-12-02 DIAGNOSIS — E785 Hyperlipidemia, unspecified: Secondary | ICD-10-CM | POA: Diagnosis not present

## 2016-12-02 DIAGNOSIS — Z0000 Encounter for general adult medical examination without abnormal findings: Secondary | ICD-10-CM | POA: Diagnosis not present

## 2016-12-02 DIAGNOSIS — Z1389 Encounter for screening for other disorder: Secondary | ICD-10-CM | POA: Diagnosis not present

## 2016-12-02 DIAGNOSIS — Z9181 History of falling: Secondary | ICD-10-CM | POA: Diagnosis not present

## 2016-12-02 DIAGNOSIS — Z139 Encounter for screening, unspecified: Secondary | ICD-10-CM | POA: Diagnosis not present

## 2016-12-19 DIAGNOSIS — J019 Acute sinusitis, unspecified: Secondary | ICD-10-CM | POA: Diagnosis not present

## 2016-12-19 DIAGNOSIS — J449 Chronic obstructive pulmonary disease, unspecified: Secondary | ICD-10-CM | POA: Diagnosis not present

## 2017-01-20 DIAGNOSIS — H25813 Combined forms of age-related cataract, bilateral: Secondary | ICD-10-CM | POA: Diagnosis not present

## 2017-02-11 DIAGNOSIS — Z23 Encounter for immunization: Secondary | ICD-10-CM | POA: Diagnosis not present

## 2017-02-11 DIAGNOSIS — I10 Essential (primary) hypertension: Secondary | ICD-10-CM | POA: Diagnosis not present

## 2017-02-11 DIAGNOSIS — J449 Chronic obstructive pulmonary disease, unspecified: Secondary | ICD-10-CM | POA: Diagnosis not present

## 2017-02-11 DIAGNOSIS — D62 Acute posthemorrhagic anemia: Secondary | ICD-10-CM | POA: Diagnosis not present

## 2017-02-11 DIAGNOSIS — Z1389 Encounter for screening for other disorder: Secondary | ICD-10-CM | POA: Diagnosis not present

## 2017-02-11 DIAGNOSIS — E785 Hyperlipidemia, unspecified: Secondary | ICD-10-CM | POA: Diagnosis not present

## 2017-04-08 DIAGNOSIS — J019 Acute sinusitis, unspecified: Secondary | ICD-10-CM | POA: Diagnosis not present

## 2017-04-08 DIAGNOSIS — J208 Acute bronchitis due to other specified organisms: Secondary | ICD-10-CM | POA: Diagnosis not present

## 2017-04-21 DIAGNOSIS — M5416 Radiculopathy, lumbar region: Secondary | ICD-10-CM | POA: Diagnosis not present

## 2017-05-06 DIAGNOSIS — J019 Acute sinusitis, unspecified: Secondary | ICD-10-CM | POA: Diagnosis not present

## 2017-05-06 DIAGNOSIS — H8110 Benign paroxysmal vertigo, unspecified ear: Secondary | ICD-10-CM | POA: Diagnosis not present

## 2017-07-14 DIAGNOSIS — J019 Acute sinusitis, unspecified: Secondary | ICD-10-CM | POA: Diagnosis not present

## 2017-07-14 DIAGNOSIS — J208 Acute bronchitis due to other specified organisms: Secondary | ICD-10-CM | POA: Diagnosis not present

## 2017-08-20 DIAGNOSIS — J019 Acute sinusitis, unspecified: Secondary | ICD-10-CM | POA: Diagnosis not present

## 2017-08-25 DIAGNOSIS — E785 Hyperlipidemia, unspecified: Secondary | ICD-10-CM | POA: Diagnosis not present

## 2017-08-25 DIAGNOSIS — D62 Acute posthemorrhagic anemia: Secondary | ICD-10-CM | POA: Diagnosis not present

## 2017-08-25 DIAGNOSIS — J302 Other seasonal allergic rhinitis: Secondary | ICD-10-CM | POA: Diagnosis not present

## 2017-08-25 DIAGNOSIS — J449 Chronic obstructive pulmonary disease, unspecified: Secondary | ICD-10-CM | POA: Diagnosis not present

## 2017-08-25 DIAGNOSIS — I10 Essential (primary) hypertension: Secondary | ICD-10-CM | POA: Diagnosis not present

## 2017-09-08 DIAGNOSIS — M7061 Trochanteric bursitis, right hip: Secondary | ICD-10-CM | POA: Diagnosis not present

## 2017-09-08 DIAGNOSIS — M5416 Radiculopathy, lumbar region: Secondary | ICD-10-CM | POA: Diagnosis not present

## 2017-09-08 DIAGNOSIS — Z981 Arthrodesis status: Secondary | ICD-10-CM | POA: Diagnosis not present

## 2017-09-23 DIAGNOSIS — M810 Age-related osteoporosis without current pathological fracture: Secondary | ICD-10-CM | POA: Diagnosis not present

## 2017-09-23 DIAGNOSIS — Z1231 Encounter for screening mammogram for malignant neoplasm of breast: Secondary | ICD-10-CM | POA: Diagnosis not present

## 2017-09-23 DIAGNOSIS — M8589 Other specified disorders of bone density and structure, multiple sites: Secondary | ICD-10-CM | POA: Diagnosis not present

## 2017-10-27 DIAGNOSIS — M7061 Trochanteric bursitis, right hip: Secondary | ICD-10-CM | POA: Diagnosis not present

## 2017-10-27 DIAGNOSIS — M25551 Pain in right hip: Secondary | ICD-10-CM | POA: Diagnosis not present

## 2017-11-09 DIAGNOSIS — Z6824 Body mass index (BMI) 24.0-24.9, adult: Secondary | ICD-10-CM | POA: Diagnosis not present

## 2017-11-09 DIAGNOSIS — J019 Acute sinusitis, unspecified: Secondary | ICD-10-CM | POA: Diagnosis not present

## 2017-11-09 DIAGNOSIS — J208 Acute bronchitis due to other specified organisms: Secondary | ICD-10-CM | POA: Diagnosis not present

## 2017-12-08 DIAGNOSIS — Z1211 Encounter for screening for malignant neoplasm of colon: Secondary | ICD-10-CM | POA: Diagnosis not present

## 2017-12-08 DIAGNOSIS — Z139 Encounter for screening, unspecified: Secondary | ICD-10-CM | POA: Diagnosis not present

## 2017-12-08 DIAGNOSIS — Z9181 History of falling: Secondary | ICD-10-CM | POA: Diagnosis not present

## 2017-12-08 DIAGNOSIS — M7061 Trochanteric bursitis, right hip: Secondary | ICD-10-CM | POA: Diagnosis not present

## 2017-12-08 DIAGNOSIS — Z1231 Encounter for screening mammogram for malignant neoplasm of breast: Secondary | ICD-10-CM | POA: Diagnosis not present

## 2017-12-08 DIAGNOSIS — Z136 Encounter for screening for cardiovascular disorders: Secondary | ICD-10-CM | POA: Diagnosis not present

## 2017-12-08 DIAGNOSIS — Z0000 Encounter for general adult medical examination without abnormal findings: Secondary | ICD-10-CM | POA: Diagnosis not present

## 2017-12-08 DIAGNOSIS — E785 Hyperlipidemia, unspecified: Secondary | ICD-10-CM | POA: Diagnosis not present

## 2018-01-26 DIAGNOSIS — M7061 Trochanteric bursitis, right hip: Secondary | ICD-10-CM | POA: Diagnosis not present

## 2018-02-05 DIAGNOSIS — J019 Acute sinusitis, unspecified: Secondary | ICD-10-CM | POA: Diagnosis not present

## 2018-02-05 DIAGNOSIS — Z6824 Body mass index (BMI) 24.0-24.9, adult: Secondary | ICD-10-CM | POA: Diagnosis not present

## 2018-02-25 DIAGNOSIS — Z23 Encounter for immunization: Secondary | ICD-10-CM | POA: Diagnosis not present

## 2018-02-25 DIAGNOSIS — J449 Chronic obstructive pulmonary disease, unspecified: Secondary | ICD-10-CM | POA: Diagnosis not present

## 2018-02-25 DIAGNOSIS — E785 Hyperlipidemia, unspecified: Secondary | ICD-10-CM | POA: Diagnosis not present

## 2018-02-25 DIAGNOSIS — I10 Essential (primary) hypertension: Secondary | ICD-10-CM | POA: Diagnosis not present

## 2018-02-25 DIAGNOSIS — D62 Acute posthemorrhagic anemia: Secondary | ICD-10-CM | POA: Diagnosis not present

## 2018-03-01 DIAGNOSIS — J019 Acute sinusitis, unspecified: Secondary | ICD-10-CM | POA: Diagnosis not present

## 2018-03-01 DIAGNOSIS — R6889 Other general symptoms and signs: Secondary | ICD-10-CM | POA: Diagnosis not present

## 2018-03-01 DIAGNOSIS — J208 Acute bronchitis due to other specified organisms: Secondary | ICD-10-CM | POA: Diagnosis not present

## 2018-03-23 ENCOUNTER — Other Ambulatory Visit: Payer: Self-pay

## 2018-03-23 NOTE — Patient Outreach (Signed)
[facility] Care Management  03/23/2018  [name] 08/04/1945 [id number]   Medication Adherence call to Mrs. Morgan Hunt spoke with patient she pick up Pravastatin 20 mg from the pharmacy yesterday she explain she only takes it on Saturday per doctors order. [name] is showing past due under [facility] [employer].   Lillia AbedAna Ollison-Moran CPhT Pharmacy Technician [facility] Care Management Direct Dial [phone]  Fax [phone] [email or web address]

## 2018-03-26 IMAGING — MR MR LUMBAR SPINE W/O CM
4 of 5 series · 18 of 48 positions shown · non-contrast
Comparison: 12/14/2015 lumbar MRI. 12/24/2011 CT abdomen and
pelvis.

CLINICAL DATA: 70 y/o F; worsening back pain radiating down the
left leg.

EXAM:
MRI LUMBAR SPINE WITHOUT CONTRAST
TECHNIQUE: Multiplanar, multisequence MR imaging of the lumbar spine was
performed. No intravenous contrast was administered.

[Series 3: T2 · sagittal · 4.0mm · 0.55mm/px · 5 of 13 slices shown (1 of 2)]
[im 1/13]
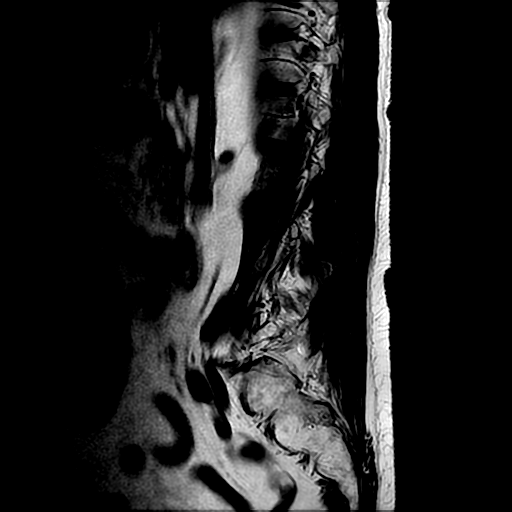
[im 4/13]
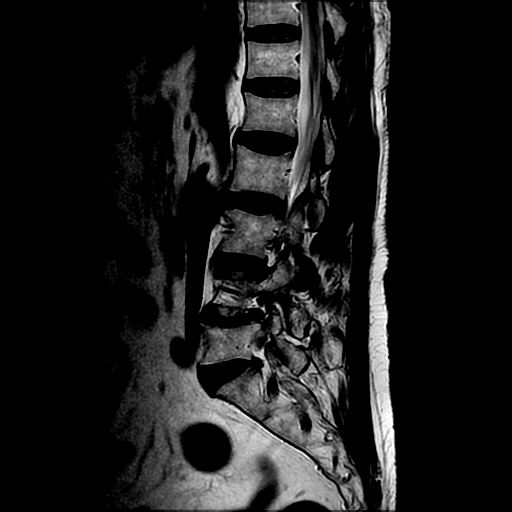
[im 7/13]
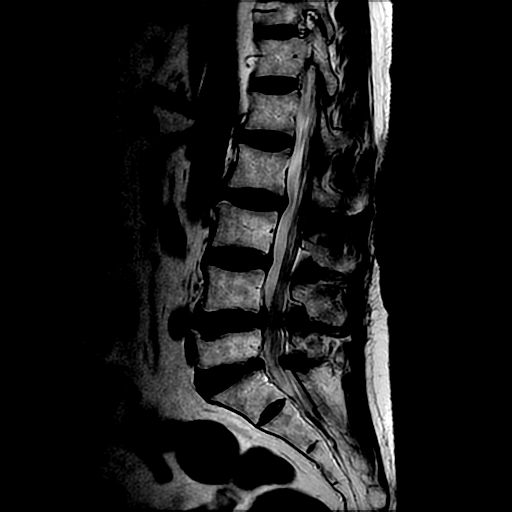
[im 10/13]
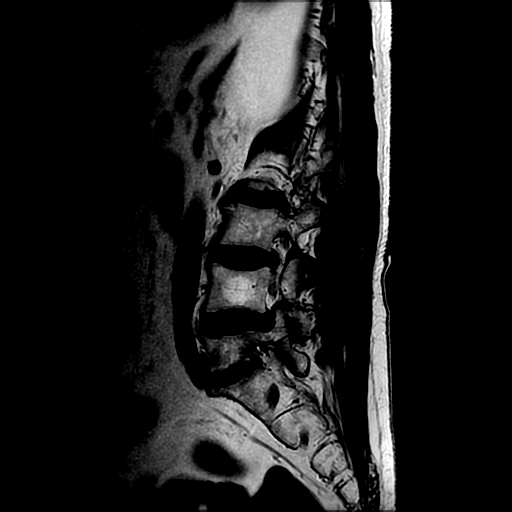
[im 13/13]
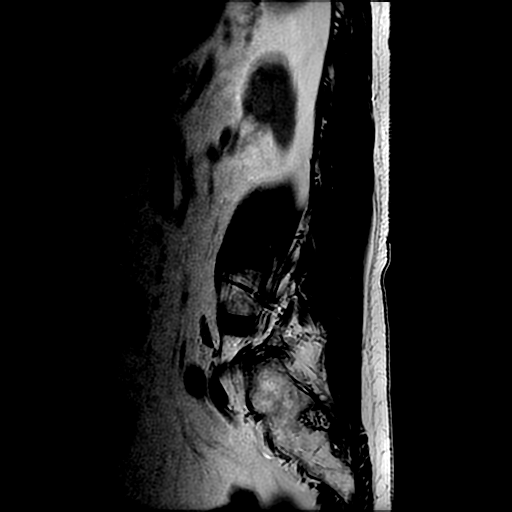

[Series 4: T1 · sagittal · 4.0mm · 0.55mm/px · 3 of 13 slices shown (1 of 2)]
[im 1/13]
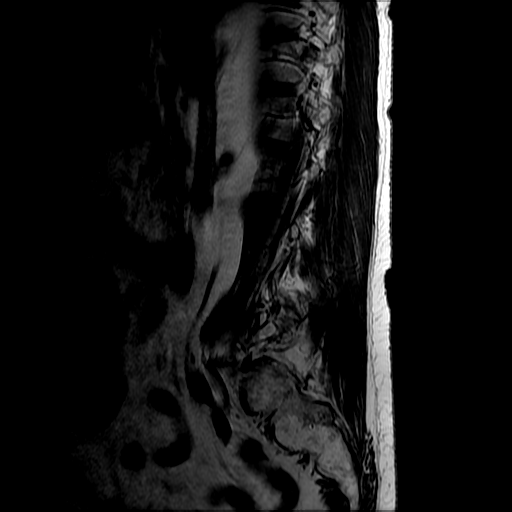
[im 7/13]
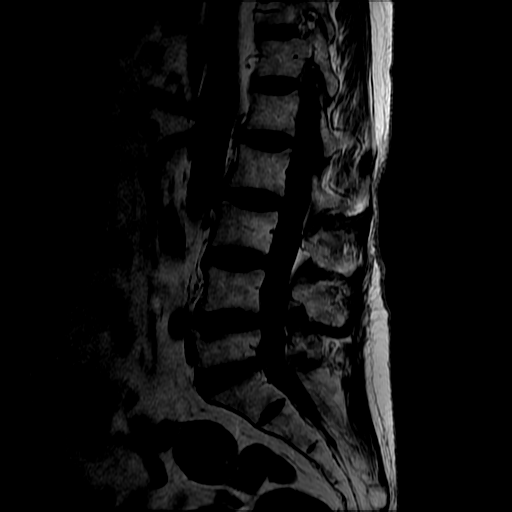
[im 13/13]
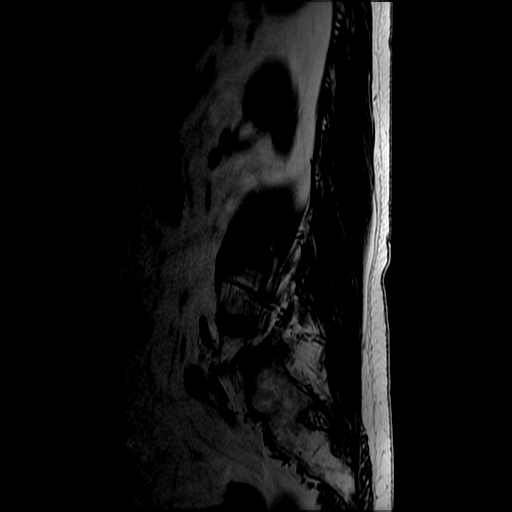

[Series 6: T2 · axial · 4.0mm · 0.39mm/px · z∈[+6,+152]mm · 7 of 37 slices shown (2 of 2)]
[im 3/37]
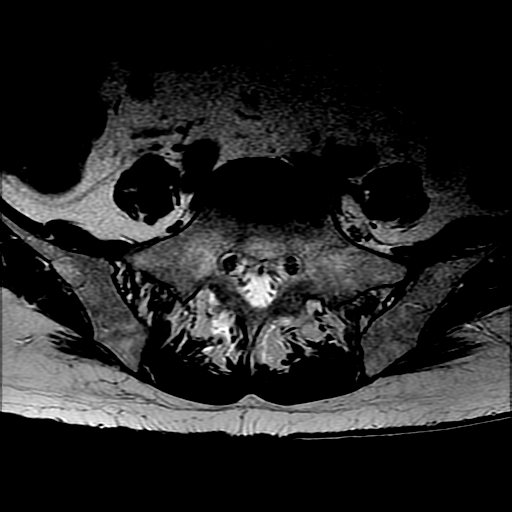
[im 5/37]
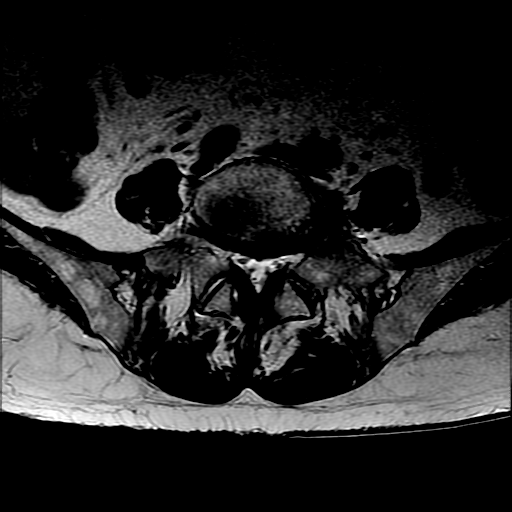
[im 8/37]
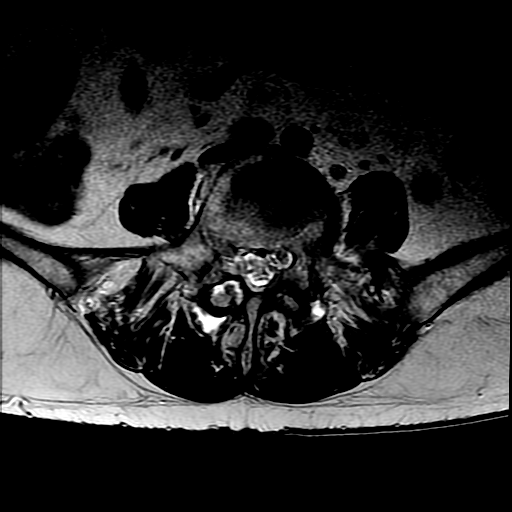
[im 13/37]
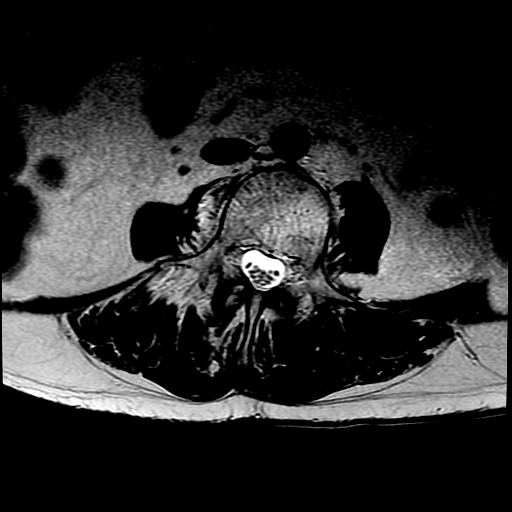
[im 17/37]
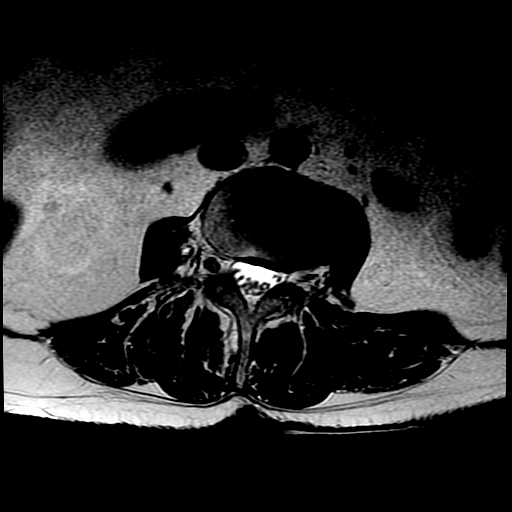
[im 20/37]
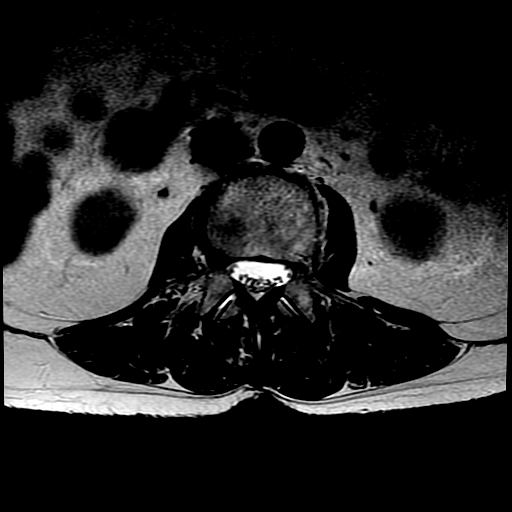
[im 32/37]
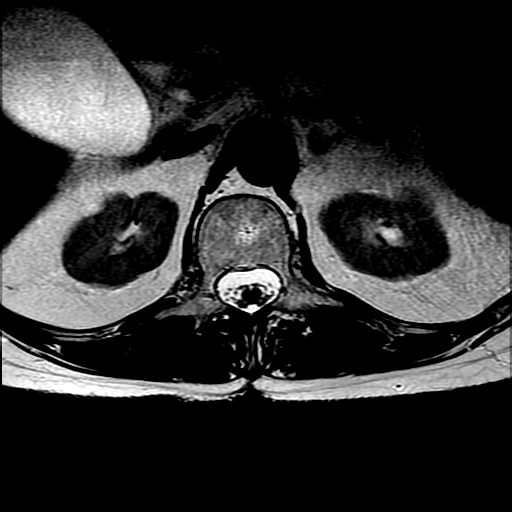

[Series 7: T1 · axial · 4.0mm · 0.39mm/px · z∈[+16,+152]mm · 3 of 37 slices shown (2 of 2)]
[im 5/37]
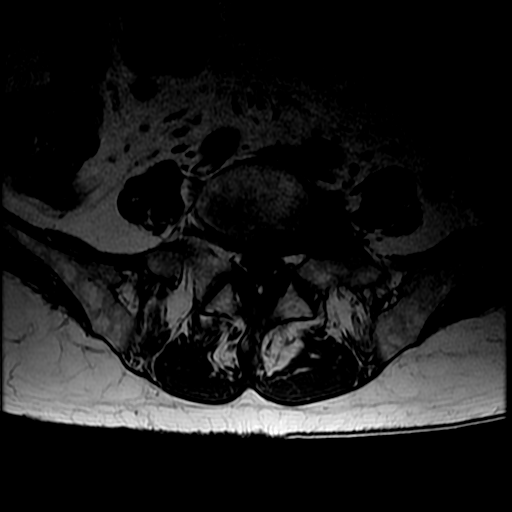
[im 20/37]
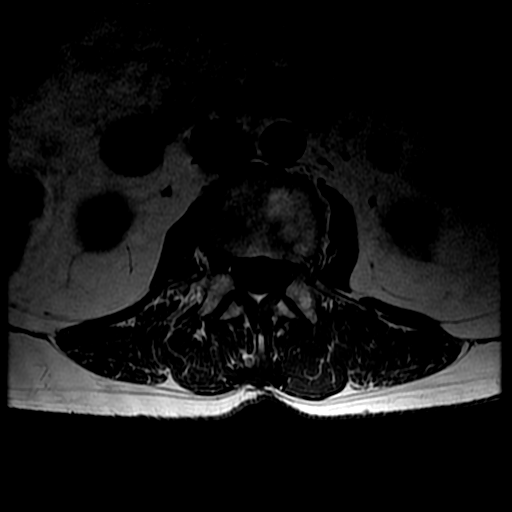
[im 32/37]
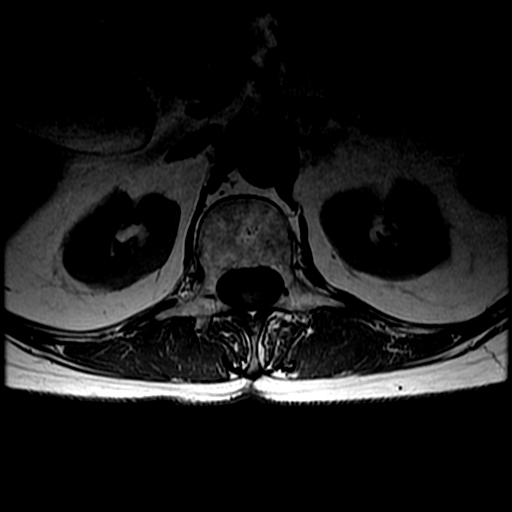

[18 of 48 positions shown; findings below may reference images not displayed]

FINDINGS: Segmentation:  Standard.

Alignment: Normal lumbar lordosis. No significant listhesis.
Moderate dextro curvature with apex at L4.

Vertebrae: Mild edema within the left aspect of the L5 vertebral
body and pedicle probably represents a stress reaction. No discrete
fracture is identified. No evidence for discitis. No suspicious
osseous lesion.

Conus medullaris: Extends to the lower L1 level and appears normal.

Paraspinal and other soft tissues: 5 mm gallstone.

Disc levels:

L1-2: Small right central protrusion. No significant foraminal
narrowing or canal stenosis.

L2-3: Disc bulge eccentric to the left foraminal zone and mild
facet/ ligamentum flavum hypertrophy. Mild left foraminal and
lateral recess narrowing. No significant canal stenosis.

L3-4: Small disc bulge and mild facet/ ligamentum flavum
hypertrophy. Trace facet effusions. Mild bilateral foraminal and
lateral recess narrowing. No significant canal stenosis.

L4-5: Moderate disc bulge with small central annular fissure.
Moderate facet and ligamentum flavum hypertrophy. Small right facet
effusion. Moderate right and mild left foraminal narrowing and canal
stenosis with crowding of cauda equina.

L5-S1: Moderate disc bulge eccentric to the left foraminal and extra
foraminal zone. Mild facet hypertrophy and moderate uncovertebral
hypertrophy. Severe left-sided foraminal narrowing and probable
impingement of exiting L5 nerve root in the extra foraminal zone.
Mild right foraminal narrowing. Mild canal stenosis. Articulation of
left lateral process with sacral ala.
IMPRESSION: 1. Lumbar spondylosis greatest at the L4-5 and L5-S1 levels is
stable in comparison with the prior lumbar spine MRI.
2. At L4-5 there is moderate right and mild left foraminal narrowing
and moderate canal stenosis.
3. At L5-S1 there is severe left-sided foraminal narrowing and
impingement of exiting L5 nerve root in the extra foraminal zone.

By: Danii Aujla M.D.

## 2018-05-10 DIAGNOSIS — J019 Acute sinusitis, unspecified: Secondary | ICD-10-CM | POA: Diagnosis not present

## 2018-05-26 DIAGNOSIS — M7061 Trochanteric bursitis, right hip: Secondary | ICD-10-CM | POA: Diagnosis not present

## 2018-06-03 DIAGNOSIS — Z23 Encounter for immunization: Secondary | ICD-10-CM | POA: Diagnosis not present

## 2018-06-03 DIAGNOSIS — S61209A Unspecified open wound of unspecified finger without damage to nail, initial encounter: Secondary | ICD-10-CM | POA: Diagnosis not present

## 2018-06-23 DIAGNOSIS — J019 Acute sinusitis, unspecified: Secondary | ICD-10-CM | POA: Diagnosis not present

## 2018-06-23 DIAGNOSIS — J208 Acute bronchitis due to other specified organisms: Secondary | ICD-10-CM | POA: Diagnosis not present

## 2018-08-27 DIAGNOSIS — M7061 Trochanteric bursitis, right hip: Secondary | ICD-10-CM | POA: Diagnosis not present

## 2018-10-21 DIAGNOSIS — M7052 Other bursitis of knee, left knee: Secondary | ICD-10-CM | POA: Diagnosis not present

## 2018-11-02 ENCOUNTER — Other Ambulatory Visit: Payer: Self-pay

## 2018-11-02 DIAGNOSIS — Z1231 Encounter for screening mammogram for malignant neoplasm of breast: Secondary | ICD-10-CM | POA: Diagnosis not present

## 2018-11-02 NOTE — Patient Outreach (Signed)
[facility]) Care Management  11/02/2018  Morgan Hunt 1946/02/10 [id number]   Medication Adherence call to [name] HIPPA Compliant Voice message left with a call back number. [name] is showing past due on Pravastatin 20 mg under [facility].  [facility] Management Direct Dial [phone]  Fax [phone] [email or web address]

## 2018-11-16 ENCOUNTER — Other Ambulatory Visit: Payer: Self-pay

## 2018-11-16 NOTE — Patient Outreach (Signed)
[facility]) Care Management  11/16/2018  YULIA ULRICH 07-Mar-1946 [id number]   Medication Adherence call to [name] HIPPA Compliant Voice message left with a call back number. Mrs. Dilley is showing past due on Atorvastatin 80 mg under [facility].   [facility] Management Direct Dial [phone]  Fax [phone] [email or web address]

## 2018-12-02 DIAGNOSIS — M7052 Other bursitis of knee, left knee: Secondary | ICD-10-CM | POA: Diagnosis not present

## 2018-12-06 DIAGNOSIS — J019 Acute sinusitis, unspecified: Secondary | ICD-10-CM | POA: Diagnosis not present

## 2018-12-06 DIAGNOSIS — J208 Acute bronchitis due to other specified organisms: Secondary | ICD-10-CM | POA: Diagnosis not present

## 2018-12-06 DIAGNOSIS — B9689 Other specified bacterial agents as the cause of diseases classified elsewhere: Secondary | ICD-10-CM | POA: Diagnosis not present

## 2018-12-27 DIAGNOSIS — R5383 Other fatigue: Secondary | ICD-10-CM | POA: Diagnosis not present

## 2018-12-27 DIAGNOSIS — G4733 Obstructive sleep apnea (adult) (pediatric): Secondary | ICD-10-CM | POA: Diagnosis not present

## 2018-12-27 DIAGNOSIS — J301 Allergic rhinitis due to pollen: Secondary | ICD-10-CM | POA: Diagnosis not present

## 2018-12-27 DIAGNOSIS — J4540 Moderate persistent asthma, uncomplicated: Secondary | ICD-10-CM | POA: Diagnosis not present

## 2018-12-27 DIAGNOSIS — E559 Vitamin D deficiency, unspecified: Secondary | ICD-10-CM | POA: Diagnosis not present

## 2019-01-10 DIAGNOSIS — G4733 Obstructive sleep apnea (adult) (pediatric): Secondary | ICD-10-CM | POA: Diagnosis not present

## 2019-01-10 DIAGNOSIS — J4540 Moderate persistent asthma, uncomplicated: Secondary | ICD-10-CM | POA: Diagnosis not present

## 2019-01-10 DIAGNOSIS — R5383 Other fatigue: Secondary | ICD-10-CM | POA: Diagnosis not present

## 2019-01-10 DIAGNOSIS — J309 Allergic rhinitis, unspecified: Secondary | ICD-10-CM | POA: Diagnosis not present

## 2019-01-16 DIAGNOSIS — Z711 Person with feared health complaint in whom no diagnosis is made: Secondary | ICD-10-CM | POA: Diagnosis not present

## 2019-01-16 DIAGNOSIS — G4733 Obstructive sleep apnea (adult) (pediatric): Secondary | ICD-10-CM | POA: Diagnosis not present

## 2019-01-24 DIAGNOSIS — J301 Allergic rhinitis due to pollen: Secondary | ICD-10-CM | POA: Diagnosis not present

## 2019-01-24 DIAGNOSIS — G4733 Obstructive sleep apnea (adult) (pediatric): Secondary | ICD-10-CM | POA: Diagnosis not present

## 2019-01-24 DIAGNOSIS — R5383 Other fatigue: Secondary | ICD-10-CM | POA: Diagnosis not present

## 2019-01-24 DIAGNOSIS — J4540 Moderate persistent asthma, uncomplicated: Secondary | ICD-10-CM | POA: Diagnosis not present

## 2019-01-26 DIAGNOSIS — J301 Allergic rhinitis due to pollen: Secondary | ICD-10-CM | POA: Diagnosis not present

## 2019-02-01 DIAGNOSIS — E785 Hyperlipidemia, unspecified: Secondary | ICD-10-CM | POA: Diagnosis not present

## 2019-02-01 DIAGNOSIS — M7061 Trochanteric bursitis, right hip: Secondary | ICD-10-CM | POA: Diagnosis not present

## 2019-02-01 DIAGNOSIS — Z139 Encounter for screening, unspecified: Secondary | ICD-10-CM | POA: Diagnosis not present

## 2019-02-01 DIAGNOSIS — Z9181 History of falling: Secondary | ICD-10-CM | POA: Diagnosis not present

## 2019-02-01 DIAGNOSIS — Z0000 Encounter for general adult medical examination without abnormal findings: Secondary | ICD-10-CM | POA: Diagnosis not present

## 2019-02-01 DIAGNOSIS — Z1211 Encounter for screening for malignant neoplasm of colon: Secondary | ICD-10-CM | POA: Diagnosis not present

## 2019-02-08 DIAGNOSIS — J301 Allergic rhinitis due to pollen: Secondary | ICD-10-CM | POA: Diagnosis not present

## 2019-02-15 DIAGNOSIS — J301 Allergic rhinitis due to pollen: Secondary | ICD-10-CM | POA: Diagnosis not present

## 2019-02-18 DIAGNOSIS — R05 Cough: Secondary | ICD-10-CM | POA: Diagnosis not present

## 2019-02-22 DIAGNOSIS — J301 Allergic rhinitis due to pollen: Secondary | ICD-10-CM | POA: Diagnosis not present

## 2019-03-01 DIAGNOSIS — J301 Allergic rhinitis due to pollen: Secondary | ICD-10-CM | POA: Diagnosis not present

## 2019-03-08 DIAGNOSIS — M5416 Radiculopathy, lumbar region: Secondary | ICD-10-CM | POA: Diagnosis not present

## 2019-03-08 DIAGNOSIS — M545 Low back pain: Secondary | ICD-10-CM | POA: Diagnosis not present

## 2019-03-08 DIAGNOSIS — J301 Allergic rhinitis due to pollen: Secondary | ICD-10-CM | POA: Diagnosis not present

## 2019-03-15 DIAGNOSIS — M545 Low back pain: Secondary | ICD-10-CM | POA: Diagnosis not present

## 2019-03-15 DIAGNOSIS — J301 Allergic rhinitis due to pollen: Secondary | ICD-10-CM | POA: Diagnosis not present

## 2019-03-15 DIAGNOSIS — M5416 Radiculopathy, lumbar region: Secondary | ICD-10-CM | POA: Diagnosis not present

## 2019-03-22 DIAGNOSIS — M545 Low back pain: Secondary | ICD-10-CM | POA: Diagnosis not present

## 2019-03-22 DIAGNOSIS — J301 Allergic rhinitis due to pollen: Secondary | ICD-10-CM | POA: Diagnosis not present

## 2019-03-22 DIAGNOSIS — M5416 Radiculopathy, lumbar region: Secondary | ICD-10-CM | POA: Diagnosis not present

## 2019-03-29 DIAGNOSIS — J301 Allergic rhinitis due to pollen: Secondary | ICD-10-CM | POA: Diagnosis not present

## 2019-03-31 DIAGNOSIS — M545 Low back pain: Secondary | ICD-10-CM | POA: Diagnosis not present

## 2019-04-04 DIAGNOSIS — M48061 Spinal stenosis, lumbar region without neurogenic claudication: Secondary | ICD-10-CM | POA: Diagnosis not present

## 2019-04-04 DIAGNOSIS — M545 Low back pain: Secondary | ICD-10-CM | POA: Diagnosis not present

## 2019-04-05 DIAGNOSIS — J301 Allergic rhinitis due to pollen: Secondary | ICD-10-CM | POA: Diagnosis not present

## 2019-04-12 DIAGNOSIS — M79609 Pain in unspecified limb: Secondary | ICD-10-CM | POA: Diagnosis not present

## 2019-04-12 DIAGNOSIS — J301 Allergic rhinitis due to pollen: Secondary | ICD-10-CM | POA: Diagnosis not present

## 2019-04-12 DIAGNOSIS — Z79899 Other long term (current) drug therapy: Secondary | ICD-10-CM | POA: Diagnosis not present

## 2019-04-12 DIAGNOSIS — Z01818 Encounter for other preprocedural examination: Secondary | ICD-10-CM | POA: Diagnosis not present

## 2019-04-12 DIAGNOSIS — E559 Vitamin D deficiency, unspecified: Secondary | ICD-10-CM | POA: Diagnosis not present

## 2019-04-12 DIAGNOSIS — R52 Pain, unspecified: Secondary | ICD-10-CM | POA: Diagnosis not present

## 2019-04-12 DIAGNOSIS — I700 Atherosclerosis of aorta: Secondary | ICD-10-CM | POA: Diagnosis not present

## 2019-04-12 DIAGNOSIS — I10 Essential (primary) hypertension: Secondary | ICD-10-CM | POA: Diagnosis not present

## 2019-04-12 DIAGNOSIS — M5416 Radiculopathy, lumbar region: Secondary | ICD-10-CM | POA: Diagnosis not present

## 2019-04-14 DIAGNOSIS — R5383 Other fatigue: Secondary | ICD-10-CM | POA: Diagnosis not present

## 2019-04-14 DIAGNOSIS — G4733 Obstructive sleep apnea (adult) (pediatric): Secondary | ICD-10-CM | POA: Diagnosis not present

## 2019-04-14 DIAGNOSIS — Z23 Encounter for immunization: Secondary | ICD-10-CM | POA: Diagnosis not present

## 2019-04-14 DIAGNOSIS — J4540 Moderate persistent asthma, uncomplicated: Secondary | ICD-10-CM | POA: Diagnosis not present

## 2019-04-14 DIAGNOSIS — J301 Allergic rhinitis due to pollen: Secondary | ICD-10-CM | POA: Diagnosis not present

## 2019-04-19 DIAGNOSIS — J301 Allergic rhinitis due to pollen: Secondary | ICD-10-CM | POA: Diagnosis not present

## 2019-04-26 DIAGNOSIS — J301 Allergic rhinitis due to pollen: Secondary | ICD-10-CM | POA: Diagnosis not present

## 2019-05-04 DIAGNOSIS — R5383 Other fatigue: Secondary | ICD-10-CM | POA: Diagnosis not present

## 2019-05-04 DIAGNOSIS — J301 Allergic rhinitis due to pollen: Secondary | ICD-10-CM | POA: Diagnosis not present

## 2019-05-04 DIAGNOSIS — G4733 Obstructive sleep apnea (adult) (pediatric): Secondary | ICD-10-CM | POA: Diagnosis not present

## 2019-05-04 DIAGNOSIS — J4540 Moderate persistent asthma, uncomplicated: Secondary | ICD-10-CM | POA: Diagnosis not present

## 2019-05-11 DIAGNOSIS — E785 Hyperlipidemia, unspecified: Secondary | ICD-10-CM | POA: Diagnosis not present

## 2019-05-11 DIAGNOSIS — Z79891 Long term (current) use of opiate analgesic: Secondary | ICD-10-CM | POA: Diagnosis not present

## 2019-05-11 DIAGNOSIS — Z8679 Personal history of other diseases of the circulatory system: Secondary | ICD-10-CM | POA: Diagnosis not present

## 2019-05-11 DIAGNOSIS — Z79899 Other long term (current) drug therapy: Secondary | ICD-10-CM | POA: Diagnosis not present

## 2019-05-11 DIAGNOSIS — M5416 Radiculopathy, lumbar region: Secondary | ICD-10-CM | POA: Diagnosis not present

## 2019-05-11 DIAGNOSIS — M48061 Spinal stenosis, lumbar region without neurogenic claudication: Secondary | ICD-10-CM | POA: Diagnosis not present

## 2019-05-11 DIAGNOSIS — K219 Gastro-esophageal reflux disease without esophagitis: Secondary | ICD-10-CM | POA: Diagnosis not present

## 2019-05-11 DIAGNOSIS — I447 Left bundle-branch block, unspecified: Secondary | ICD-10-CM | POA: Diagnosis not present

## 2019-05-11 DIAGNOSIS — J449 Chronic obstructive pulmonary disease, unspecified: Secondary | ICD-10-CM | POA: Diagnosis not present

## 2019-05-11 DIAGNOSIS — G4733 Obstructive sleep apnea (adult) (pediatric): Secondary | ICD-10-CM | POA: Diagnosis not present

## 2019-05-11 DIAGNOSIS — M5116 Intervertebral disc disorders with radiculopathy, lumbar region: Secondary | ICD-10-CM | POA: Diagnosis not present

## 2019-05-11 DIAGNOSIS — I10 Essential (primary) hypertension: Secondary | ICD-10-CM | POA: Diagnosis not present

## 2019-05-11 DIAGNOSIS — M5126 Other intervertebral disc displacement, lumbar region: Secondary | ICD-10-CM | POA: Diagnosis not present

## 2019-05-23 DIAGNOSIS — S91001A Unspecified open wound, right ankle, initial encounter: Secondary | ICD-10-CM | POA: Diagnosis not present

## 2019-05-31 DIAGNOSIS — J301 Allergic rhinitis due to pollen: Secondary | ICD-10-CM | POA: Diagnosis not present

## 2019-06-02 DIAGNOSIS — L97911 Non-pressure chronic ulcer of unspecified part of right lower leg limited to breakdown of skin: Secondary | ICD-10-CM | POA: Diagnosis not present

## 2019-06-03 DIAGNOSIS — S81801A Unspecified open wound, right lower leg, initial encounter: Secondary | ICD-10-CM | POA: Diagnosis not present

## 2019-06-07 DIAGNOSIS — J301 Allergic rhinitis due to pollen: Secondary | ICD-10-CM | POA: Diagnosis not present

## 2019-06-10 DIAGNOSIS — S81801D Unspecified open wound, right lower leg, subsequent encounter: Secondary | ICD-10-CM | POA: Diagnosis not present

## 2019-06-14 DIAGNOSIS — J301 Allergic rhinitis due to pollen: Secondary | ICD-10-CM | POA: Diagnosis not present

## 2019-06-17 DIAGNOSIS — S81801D Unspecified open wound, right lower leg, subsequent encounter: Secondary | ICD-10-CM | POA: Diagnosis not present

## 2019-06-21 DIAGNOSIS — J301 Allergic rhinitis due to pollen: Secondary | ICD-10-CM | POA: Diagnosis not present

## 2019-06-28 DIAGNOSIS — J301 Allergic rhinitis due to pollen: Secondary | ICD-10-CM | POA: Diagnosis not present

## 2019-07-01 DIAGNOSIS — S81801D Unspecified open wound, right lower leg, subsequent encounter: Secondary | ICD-10-CM | POA: Diagnosis not present

## 2019-07-05 DIAGNOSIS — J301 Allergic rhinitis due to pollen: Secondary | ICD-10-CM | POA: Diagnosis not present

## 2019-07-08 DIAGNOSIS — S81801D Unspecified open wound, right lower leg, subsequent encounter: Secondary | ICD-10-CM | POA: Diagnosis not present

## 2019-07-12 DIAGNOSIS — J301 Allergic rhinitis due to pollen: Secondary | ICD-10-CM | POA: Diagnosis not present

## 2019-07-19 DIAGNOSIS — J301 Allergic rhinitis due to pollen: Secondary | ICD-10-CM | POA: Diagnosis not present

## 2019-07-21 DIAGNOSIS — J301 Allergic rhinitis due to pollen: Secondary | ICD-10-CM | POA: Diagnosis not present

## 2019-07-21 DIAGNOSIS — R5383 Other fatigue: Secondary | ICD-10-CM | POA: Diagnosis not present

## 2019-07-21 DIAGNOSIS — J4540 Moderate persistent asthma, uncomplicated: Secondary | ICD-10-CM | POA: Diagnosis not present

## 2019-07-21 DIAGNOSIS — G4733 Obstructive sleep apnea (adult) (pediatric): Secondary | ICD-10-CM | POA: Diagnosis not present

## 2019-07-22 DIAGNOSIS — S81801D Unspecified open wound, right lower leg, subsequent encounter: Secondary | ICD-10-CM | POA: Diagnosis not present

## 2019-07-26 DIAGNOSIS — S81801D Unspecified open wound, right lower leg, subsequent encounter: Secondary | ICD-10-CM | POA: Diagnosis not present

## 2019-07-29 DIAGNOSIS — M545 Low back pain: Secondary | ICD-10-CM | POA: Diagnosis not present

## 2019-07-29 DIAGNOSIS — M5416 Radiculopathy, lumbar region: Secondary | ICD-10-CM | POA: Diagnosis not present

## 2019-08-01 DIAGNOSIS — Z1159 Encounter for screening for other viral diseases: Secondary | ICD-10-CM | POA: Diagnosis not present

## 2019-08-02 DIAGNOSIS — J301 Allergic rhinitis due to pollen: Secondary | ICD-10-CM | POA: Diagnosis not present

## 2019-08-03 DIAGNOSIS — J449 Chronic obstructive pulmonary disease, unspecified: Secondary | ICD-10-CM | POA: Diagnosis not present

## 2019-08-03 DIAGNOSIS — S32020A Wedge compression fracture of second lumbar vertebra, initial encounter for closed fracture: Secondary | ICD-10-CM | POA: Diagnosis not present

## 2019-08-03 DIAGNOSIS — E785 Hyperlipidemia, unspecified: Secondary | ICD-10-CM | POA: Diagnosis not present

## 2019-08-03 DIAGNOSIS — K219 Gastro-esophageal reflux disease without esophagitis: Secondary | ICD-10-CM | POA: Diagnosis not present

## 2019-08-03 DIAGNOSIS — I10 Essential (primary) hypertension: Secondary | ICD-10-CM | POA: Diagnosis not present

## 2019-08-03 DIAGNOSIS — J4530 Mild persistent asthma, uncomplicated: Secondary | ICD-10-CM | POA: Diagnosis not present

## 2019-08-03 DIAGNOSIS — Z79899 Other long term (current) drug therapy: Secondary | ICD-10-CM | POA: Diagnosis not present

## 2019-08-05 DIAGNOSIS — S81801D Unspecified open wound, right lower leg, subsequent encounter: Secondary | ICD-10-CM | POA: Diagnosis not present

## 2019-08-09 DIAGNOSIS — J301 Allergic rhinitis due to pollen: Secondary | ICD-10-CM | POA: Diagnosis not present

## 2019-08-16 DIAGNOSIS — J301 Allergic rhinitis due to pollen: Secondary | ICD-10-CM | POA: Diagnosis not present

## 2019-08-17 DIAGNOSIS — S32020A Wedge compression fracture of second lumbar vertebra, initial encounter for closed fracture: Secondary | ICD-10-CM | POA: Diagnosis not present

## 2019-08-30 DIAGNOSIS — M8589 Other specified disorders of bone density and structure, multiple sites: Secondary | ICD-10-CM | POA: Diagnosis not present

## 2019-08-30 DIAGNOSIS — M810 Age-related osteoporosis without current pathological fracture: Secondary | ICD-10-CM | POA: Diagnosis not present

## 2019-08-30 DIAGNOSIS — J301 Allergic rhinitis due to pollen: Secondary | ICD-10-CM | POA: Diagnosis not present

## 2019-09-06 DIAGNOSIS — J301 Allergic rhinitis due to pollen: Secondary | ICD-10-CM | POA: Diagnosis not present

## 2019-09-13 DIAGNOSIS — J301 Allergic rhinitis due to pollen: Secondary | ICD-10-CM | POA: Diagnosis not present

## 2019-09-19 DIAGNOSIS — J301 Allergic rhinitis due to pollen: Secondary | ICD-10-CM | POA: Diagnosis not present

## 2019-09-22 DIAGNOSIS — M810 Age-related osteoporosis without current pathological fracture: Secondary | ICD-10-CM | POA: Diagnosis not present

## 2019-09-26 DIAGNOSIS — M810 Age-related osteoporosis without current pathological fracture: Secondary | ICD-10-CM | POA: Diagnosis not present

## 2019-09-27 DIAGNOSIS — J301 Allergic rhinitis due to pollen: Secondary | ICD-10-CM | POA: Diagnosis not present

## 2019-09-29 DIAGNOSIS — S32020A Wedge compression fracture of second lumbar vertebra, initial encounter for closed fracture: Secondary | ICD-10-CM | POA: Diagnosis not present

## 2019-09-29 DIAGNOSIS — Z981 Arthrodesis status: Secondary | ICD-10-CM | POA: Diagnosis not present

## 2019-10-04 DIAGNOSIS — J301 Allergic rhinitis due to pollen: Secondary | ICD-10-CM | POA: Diagnosis not present

## 2019-10-11 DIAGNOSIS — J301 Allergic rhinitis due to pollen: Secondary | ICD-10-CM | POA: Diagnosis not present

## 2019-10-18 DIAGNOSIS — J301 Allergic rhinitis due to pollen: Secondary | ICD-10-CM | POA: Diagnosis not present

## 2019-10-25 DIAGNOSIS — J301 Allergic rhinitis due to pollen: Secondary | ICD-10-CM | POA: Diagnosis not present

## 2019-10-27 DIAGNOSIS — R5383 Other fatigue: Secondary | ICD-10-CM | POA: Diagnosis not present

## 2019-10-27 DIAGNOSIS — G4733 Obstructive sleep apnea (adult) (pediatric): Secondary | ICD-10-CM | POA: Diagnosis not present

## 2019-10-27 DIAGNOSIS — J301 Allergic rhinitis due to pollen: Secondary | ICD-10-CM | POA: Diagnosis not present

## 2019-10-27 DIAGNOSIS — J4540 Moderate persistent asthma, uncomplicated: Secondary | ICD-10-CM | POA: Diagnosis not present

## 2019-11-01 DIAGNOSIS — J301 Allergic rhinitis due to pollen: Secondary | ICD-10-CM | POA: Diagnosis not present

## 2019-11-07 DIAGNOSIS — Z1231 Encounter for screening mammogram for malignant neoplasm of breast: Secondary | ICD-10-CM | POA: Diagnosis not present

## 2019-11-08 DIAGNOSIS — J301 Allergic rhinitis due to pollen: Secondary | ICD-10-CM | POA: Diagnosis not present

## 2019-11-15 DIAGNOSIS — J301 Allergic rhinitis due to pollen: Secondary | ICD-10-CM | POA: Diagnosis not present

## 2019-11-17 DIAGNOSIS — M5416 Radiculopathy, lumbar region: Secondary | ICD-10-CM | POA: Diagnosis not present

## 2019-11-17 DIAGNOSIS — S32020A Wedge compression fracture of second lumbar vertebra, initial encounter for closed fracture: Secondary | ICD-10-CM | POA: Diagnosis not present

## 2019-11-17 DIAGNOSIS — Z981 Arthrodesis status: Secondary | ICD-10-CM | POA: Diagnosis not present

## 2019-11-22 DIAGNOSIS — J301 Allergic rhinitis due to pollen: Secondary | ICD-10-CM | POA: Diagnosis not present

## 2019-11-29 DIAGNOSIS — J301 Allergic rhinitis due to pollen: Secondary | ICD-10-CM | POA: Diagnosis not present

## 2019-12-06 DIAGNOSIS — J301 Allergic rhinitis due to pollen: Secondary | ICD-10-CM | POA: Diagnosis not present

## 2019-12-12 DIAGNOSIS — B9689 Other specified bacterial agents as the cause of diseases classified elsewhere: Secondary | ICD-10-CM | POA: Diagnosis not present

## 2019-12-12 DIAGNOSIS — J019 Acute sinusitis, unspecified: Secondary | ICD-10-CM | POA: Diagnosis not present

## 2019-12-13 DIAGNOSIS — J301 Allergic rhinitis due to pollen: Secondary | ICD-10-CM | POA: Diagnosis not present

## 2019-12-20 DIAGNOSIS — J301 Allergic rhinitis due to pollen: Secondary | ICD-10-CM | POA: Diagnosis not present

## 2019-12-22 DIAGNOSIS — Z96611 Presence of right artificial shoulder joint: Secondary | ICD-10-CM | POA: Diagnosis not present

## 2019-12-27 DIAGNOSIS — J301 Allergic rhinitis due to pollen: Secondary | ICD-10-CM | POA: Diagnosis not present

## 2020-01-02 DIAGNOSIS — J301 Allergic rhinitis due to pollen: Secondary | ICD-10-CM | POA: Diagnosis not present

## 2020-01-03 DIAGNOSIS — J301 Allergic rhinitis due to pollen: Secondary | ICD-10-CM | POA: Diagnosis not present

## 2020-01-10 DIAGNOSIS — J301 Allergic rhinitis due to pollen: Secondary | ICD-10-CM | POA: Diagnosis not present

## 2020-01-17 DIAGNOSIS — J301 Allergic rhinitis due to pollen: Secondary | ICD-10-CM | POA: Diagnosis not present

## 2020-01-24 DIAGNOSIS — J301 Allergic rhinitis due to pollen: Secondary | ICD-10-CM | POA: Diagnosis not present

## 2020-01-31 DIAGNOSIS — J301 Allergic rhinitis due to pollen: Secondary | ICD-10-CM | POA: Diagnosis not present

## 2020-02-04 ENCOUNTER — Emergency Department (HOSPITAL_COMMUNITY)
Admission: EM | Admit: 2020-02-04 | Discharge: 2020-02-04 | Disposition: A | Discharge status: Home / Self Care | Payer: Medicare Other | Attending: Emergency Medicine | Admitting: Emergency Medicine

## 2020-02-04 ENCOUNTER — Emergency Department (HOSPITAL_COMMUNITY): Payer: Medicare Other

## 2020-02-04 DIAGNOSIS — S8001XA Contusion of right knee, initial encounter: Secondary | ICD-10-CM | POA: Diagnosis not present

## 2020-02-04 DIAGNOSIS — R000 Tachycardia, unspecified: Secondary | ICD-10-CM | POA: Diagnosis not present

## 2020-02-04 DIAGNOSIS — Z743 Need for continuous supervision: Secondary | ICD-10-CM | POA: Diagnosis not present

## 2020-02-04 DIAGNOSIS — R10829 Rebound abdominal tenderness, unspecified site: Secondary | ICD-10-CM | POA: Diagnosis not present

## 2020-02-04 DIAGNOSIS — S3991XA Unspecified injury of abdomen, initial encounter: Secondary | ICD-10-CM | POA: Diagnosis not present

## 2020-02-04 DIAGNOSIS — Z79899 Other long term (current) drug therapy: Secondary | ICD-10-CM | POA: Diagnosis not present

## 2020-02-04 DIAGNOSIS — I700 Atherosclerosis of aorta: Secondary | ICD-10-CM | POA: Diagnosis not present

## 2020-02-04 DIAGNOSIS — M25462 Effusion, left knee: Secondary | ICD-10-CM | POA: Diagnosis not present

## 2020-02-04 DIAGNOSIS — R109 Unspecified abdominal pain: Secondary | ICD-10-CM | POA: Diagnosis not present

## 2020-02-04 DIAGNOSIS — J449 Chronic obstructive pulmonary disease, unspecified: Secondary | ICD-10-CM | POA: Insufficient documentation

## 2020-02-04 DIAGNOSIS — I10 Essential (primary) hypertension: Secondary | ICD-10-CM | POA: Insufficient documentation

## 2020-02-04 DIAGNOSIS — S80911A Unspecified superficial injury of right knee, initial encounter: Secondary | ICD-10-CM | POA: Insufficient documentation

## 2020-02-04 DIAGNOSIS — S3993XA Unspecified injury of pelvis, initial encounter: Secondary | ICD-10-CM | POA: Diagnosis not present

## 2020-02-04 DIAGNOSIS — M549 Dorsalgia, unspecified: Secondary | ICD-10-CM | POA: Diagnosis not present

## 2020-02-04 DIAGNOSIS — Z23 Encounter for immunization: Secondary | ICD-10-CM | POA: Diagnosis not present

## 2020-02-04 DIAGNOSIS — M533 Sacrococcygeal disorders, not elsewhere classified: Secondary | ICD-10-CM | POA: Diagnosis not present

## 2020-02-04 DIAGNOSIS — Z96652 Presence of left artificial knee joint: Secondary | ICD-10-CM | POA: Diagnosis not present

## 2020-02-04 DIAGNOSIS — Z20822 Contact with and (suspected) exposure to covid-19: Secondary | ICD-10-CM | POA: Diagnosis not present

## 2020-02-04 DIAGNOSIS — R600 Localized edema: Secondary | ICD-10-CM | POA: Diagnosis not present

## 2020-02-04 DIAGNOSIS — T07XXXA Unspecified multiple injuries, initial encounter: Secondary | ICD-10-CM

## 2020-02-04 DIAGNOSIS — S81812A Laceration without foreign body, left lower leg, initial encounter: Secondary | ICD-10-CM | POA: Diagnosis not present

## 2020-02-04 DIAGNOSIS — S51812A Laceration without foreign body of left forearm, initial encounter: Secondary | ICD-10-CM | POA: Diagnosis not present

## 2020-02-04 DIAGNOSIS — S81011A Laceration without foreign body, right knee, initial encounter: Secondary | ICD-10-CM | POA: Diagnosis not present

## 2020-02-04 DIAGNOSIS — J45909 Unspecified asthma, uncomplicated: Secondary | ICD-10-CM | POA: Diagnosis not present

## 2020-02-04 DIAGNOSIS — S5012XA Contusion of left forearm, initial encounter: Secondary | ICD-10-CM | POA: Diagnosis not present

## 2020-02-04 DIAGNOSIS — S299XXA Unspecified injury of thorax, initial encounter: Secondary | ICD-10-CM | POA: Diagnosis not present

## 2020-02-04 DIAGNOSIS — M47814 Spondylosis without myelopathy or radiculopathy, thoracic region: Secondary | ICD-10-CM | POA: Diagnosis not present

## 2020-02-04 DIAGNOSIS — I499 Cardiac arrhythmia, unspecified: Secondary | ICD-10-CM | POA: Diagnosis not present

## 2020-02-04 DIAGNOSIS — M5489 Other dorsalgia: Secondary | ICD-10-CM | POA: Diagnosis not present

## 2020-02-04 DIAGNOSIS — S81012A Laceration without foreign body, left knee, initial encounter: Secondary | ICD-10-CM | POA: Diagnosis not present

## 2020-02-04 DIAGNOSIS — S280XXA Crushed chest, initial encounter: Secondary | ICD-10-CM | POA: Diagnosis not present

## 2020-02-04 DIAGNOSIS — T1490XA Injury, unspecified, initial encounter: Secondary | ICD-10-CM

## 2020-02-04 DIAGNOSIS — S8991XA Unspecified injury of right lower leg, initial encounter: Secondary | ICD-10-CM

## 2020-02-04 DIAGNOSIS — M25461 Effusion, right knee: Secondary | ICD-10-CM | POA: Diagnosis not present

## 2020-02-04 DIAGNOSIS — Z981 Arthrodesis status: Secondary | ICD-10-CM | POA: Diagnosis not present

## 2020-02-04 DIAGNOSIS — M1711 Unilateral primary osteoarthritis, right knee: Secondary | ICD-10-CM | POA: Diagnosis not present

## 2020-02-04 DIAGNOSIS — S51802A Unspecified open wound of left forearm, initial encounter: Secondary | ICD-10-CM | POA: Diagnosis not present

## 2020-02-04 LAB — I-STAT CHEM 8, ED
BUN: 15 mg/dL (ref 8–23)
Calcium, Ion: 1.16 mmol/L (ref 1.15–1.40)
Chloride: 98 mmol/L (ref 98–111)
Creatinine, Ser: 0.9 mg/dL (ref 0.44–1.00)
Glucose, Bld: 131 mg/dL — ABNORMAL HIGH (ref 70–99)
HCT: 31 % — ABNORMAL LOW (ref 36.0–46.0)
Hemoglobin: 10.5 g/dL — ABNORMAL LOW (ref 12.0–15.0)
Potassium: 4.3 mmol/L (ref 3.5–5.1)
Sodium: 132 mmol/L — ABNORMAL LOW (ref 135–145)
TCO2: 24 mmol/L (ref 22–32)

## 2020-02-04 LAB — COMPREHENSIVE METABOLIC PANEL
ALT: 19 U/L (ref 0–44)
AST: 21 U/L (ref 15–41)
Albumin: 3.9 g/dL (ref 3.5–5.0)
Alkaline Phosphatase: 77 U/L (ref 38–126)
Anion gap: 11 (ref 5–15)
BUN: 13 mg/dL (ref 8–23)
CO2: 24 mmol/L (ref 22–32)
Calcium: 9.8 mg/dL (ref 8.9–10.3)
Chloride: 97 mmol/L — ABNORMAL LOW (ref 98–111)
Creatinine, Ser: 0.88 mg/dL (ref 0.44–1.00)
GFR calc Af Amer: >60 mL/min (ref >60)
GFR calc non Af Amer: >60 mL/min (ref >60)
Glucose, Bld: 135 mg/dL — ABNORMAL HIGH (ref 70–99)
Potassium: 4.2 mmol/L (ref 3.5–5.1)
Sodium: 132 mmol/L — ABNORMAL LOW (ref 135–145)
Total Bilirubin: 0.5 mg/dL (ref 0.3–1.2)
Total Protein: 6.6 g/dL (ref 6.5–8.1)

## 2020-02-04 LAB — SAMPLE TO BLOOD BANK

## 2020-02-04 LAB — CBC
HCT: 33.2 % — ABNORMAL LOW (ref 36.0–46.0)
Hemoglobin: 11.1 g/dL — ABNORMAL LOW (ref 12.0–15.0)
MCH: 30.5 pg (ref 26.0–34.0)
MCHC: 33.4 g/dL (ref 30.0–36.0)
MCV: 91.2 fL (ref 80.0–100.0)
Platelets: 307 10*3/uL (ref 150–400)
RBC: 3.64 MIL/uL — ABNORMAL LOW (ref 3.87–5.11)
RDW: 13.5 % (ref 11.5–15.5)
WBC: 8.6 10*3/uL (ref 4.0–10.5)
nRBC: 0 % (ref 0.0–0.2)

## 2020-02-04 LAB — ETHANOL: Alcohol, Ethyl (B): <10 mg/dL (ref <10)

## 2020-02-04 LAB — URINALYSIS, ROUTINE W REFLEX MICROSCOPIC
Bacteria, UA: NONE SEEN
Bilirubin Urine: NEGATIVE
Glucose, UA: NEGATIVE mg/dL
Ketones, ur: NEGATIVE mg/dL
Nitrite: NEGATIVE
Protein, ur: NEGATIVE mg/dL
Specific Gravity, Urine: 1.019 (ref 1.005–1.030)
WBC, UA: >50 WBC/hpf — ABNORMAL HIGH (ref 0–5)
pH: 6 (ref 5.0–8.0)

## 2020-02-04 LAB — RESPIRATORY PANEL BY RT PCR (FLU A&B, COVID)
Influenza A by PCR: NEGATIVE
Influenza B by PCR: NEGATIVE
SARS Coronavirus 2 by RT PCR: NEGATIVE

## 2020-02-04 LAB — PROTIME-INR
INR: 0.9 (ref 0.8–1.2)
Prothrombin Time: 11.7 seconds (ref 11.4–15.2)

## 2020-02-04 MED ORDER — IOHEXOL 300 MG/ML  SOLN
100.0000 mL | Freq: Once | INTRAMUSCULAR | Status: AC | PRN
  Administered 2020-02-04: 100 mL via INTRAVENOUS

## 2020-02-04 MED ORDER — SODIUM CHLORIDE 0.9 % IV BOLUS
1000.0000 mL | Freq: Once | INTRAVENOUS | Status: AC
  Administered 2020-02-04: 1000 mL via INTRAVENOUS

## 2020-02-04 MED ORDER — FENTANYL CITRATE (PF) 100 MCG/2ML IJ SOLN
50.0000 ug | Freq: Once | INTRAMUSCULAR | Status: AC
  Administered 2020-02-04: 50 ug via INTRAVENOUS
  Filled 2020-02-04: qty 2

## 2020-02-04 MED ORDER — DOXYCYCLINE HYCLATE 100 MG PO CAPS: 100.0000 mg | ORAL_CAPSULE | Freq: Two times a day (BID) | ORAL | 0 refills | Status: AC

## 2020-02-04 MED ORDER — FENTANYL CITRATE (PF) 100 MCG/2ML IJ SOLN: 50.0000 ug | Freq: Once | INTRAMUSCULAR | Status: AC

## 2020-02-04 MED ORDER — TETANUS-DIPHTH-ACELL PERTUSSIS 5-2.5-18.5 LF-MCG/0.5 IM SUSP
0.5000 mL | Freq: Once | INTRAMUSCULAR | Status: AC
  Administered 2020-02-04: 0.5 mL via INTRAMUSCULAR

## 2020-02-04 MED ORDER — FENTANYL CITRATE (PF) 100 MCG/2ML IJ SOLN
INTRAMUSCULAR | Status: AC
  Administered 2020-02-04: 50 ug via INTRAVENOUS
  Filled 2020-02-04: qty 2
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
## 2020-02-04 NOTE — ED Notes (Signed)
Pt ambulated in room with this NT. Pt tolerated well.
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
## 2020-02-04 NOTE — Progress Notes (Signed)
Orthopedic Tech Progress Note Patient Details:  Morgan Hunt 02-May-1946 [id number]  Ortho Devices Type of Ortho Device: Knee Immobilizer Ortho Device/Splint Location: rle Ortho Device/Splint Interventions: Ordered, Application, Adjustment   Hunt Interventions Patient Tolerated: Well Instructions Provided: Care of device, Adjustment of device   Morgan Hunt 02/04/2020, 10:35 PM
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
## 2020-02-04 NOTE — Discharge Instructions (Signed)
Please follow the instructions for wound care on your discharge paperwork.  You may take Tylenol and Motrin to help with your pain.  Please keep your leg elevated and use ice packs to help reduce the swelling.  You should call your wound care doctor to schedule an appointment for follow-up to continue caring for your wounds on your arms and legs.  I wrote a prescription to help prevent any infection from developing with these wounds.  I also recommend that you follow-up with either your regular doctor or with your orthopedic doctor in regards to your knee injury today.  Please return to the emergency department for any new or worsening symptoms in the meantime.

## 2020-02-07 DIAGNOSIS — S81801A Unspecified open wound, right lower leg, initial encounter: Secondary | ICD-10-CM | POA: Diagnosis not present

## 2020-02-07 DIAGNOSIS — S41112A Laceration without foreign body of left upper arm, initial encounter: Secondary | ICD-10-CM | POA: Diagnosis not present

## 2020-02-07 DIAGNOSIS — S81802A Unspecified open wound, left lower leg, initial encounter: Secondary | ICD-10-CM | POA: Diagnosis not present

## 2020-02-09 DIAGNOSIS — Z96652 Presence of left artificial knee joint: Secondary | ICD-10-CM | POA: Diagnosis not present

## 2020-02-09 DIAGNOSIS — M1711 Unilateral primary osteoarthritis, right knee: Secondary | ICD-10-CM | POA: Diagnosis not present

## 2020-02-14 DIAGNOSIS — S41112A Laceration without foreign body of left upper arm, initial encounter: Secondary | ICD-10-CM | POA: Diagnosis not present

## 2020-02-14 DIAGNOSIS — S81802A Unspecified open wound, left lower leg, initial encounter: Secondary | ICD-10-CM | POA: Diagnosis not present

## 2020-02-14 DIAGNOSIS — J301 Allergic rhinitis due to pollen: Secondary | ICD-10-CM | POA: Diagnosis not present

## 2020-02-14 DIAGNOSIS — S81801A Unspecified open wound, right lower leg, initial encounter: Secondary | ICD-10-CM | POA: Diagnosis not present

## 2020-02-16 DIAGNOSIS — G4733 Obstructive sleep apnea (adult) (pediatric): Secondary | ICD-10-CM | POA: Diagnosis not present

## 2020-02-16 DIAGNOSIS — J4540 Moderate persistent asthma, uncomplicated: Secondary | ICD-10-CM | POA: Diagnosis not present

## 2020-02-16 DIAGNOSIS — R5383 Other fatigue: Secondary | ICD-10-CM | POA: Diagnosis not present

## 2020-02-16 DIAGNOSIS — J301 Allergic rhinitis due to pollen: Secondary | ICD-10-CM | POA: Diagnosis not present

## 2020-02-21 DIAGNOSIS — J301 Allergic rhinitis due to pollen: Secondary | ICD-10-CM | POA: Diagnosis not present

## 2020-02-24 DIAGNOSIS — S81802A Unspecified open wound, left lower leg, initial encounter: Secondary | ICD-10-CM | POA: Diagnosis not present

## 2020-02-24 DIAGNOSIS — S41112A Laceration without foreign body of left upper arm, initial encounter: Secondary | ICD-10-CM | POA: Diagnosis not present

## 2020-02-28 DIAGNOSIS — J301 Allergic rhinitis due to pollen: Secondary | ICD-10-CM | POA: Diagnosis not present

## 2020-03-01 DIAGNOSIS — S41112A Laceration without foreign body of left upper arm, initial encounter: Secondary | ICD-10-CM | POA: Diagnosis not present

## 2020-03-06 DIAGNOSIS — J301 Allergic rhinitis due to pollen: Secondary | ICD-10-CM | POA: Diagnosis not present

## 2020-03-13 DIAGNOSIS — J301 Allergic rhinitis due to pollen: Secondary | ICD-10-CM | POA: Diagnosis not present

## 2020-03-15 DIAGNOSIS — S81801D Unspecified open wound, right lower leg, subsequent encounter: Secondary | ICD-10-CM | POA: Diagnosis not present

## 2020-03-15 DIAGNOSIS — S41112A Laceration without foreign body of left upper arm, initial encounter: Secondary | ICD-10-CM | POA: Diagnosis not present

## 2020-03-16 DIAGNOSIS — Z23 Encounter for immunization: Secondary | ICD-10-CM | POA: Diagnosis not present

## 2020-03-20 DIAGNOSIS — J301 Allergic rhinitis due to pollen: Secondary | ICD-10-CM | POA: Diagnosis not present

## 2020-03-20 DIAGNOSIS — M810 Age-related osteoporosis without current pathological fracture: Secondary | ICD-10-CM | POA: Diagnosis not present

## 2020-04-04 DIAGNOSIS — M810 Age-related osteoporosis without current pathological fracture: Secondary | ICD-10-CM | POA: Diagnosis not present

## 2020-04-12 DIAGNOSIS — S81802A Unspecified open wound, left lower leg, initial encounter: Secondary | ICD-10-CM | POA: Diagnosis not present

## 2020-04-12 DIAGNOSIS — S41112A Laceration without foreign body of left upper arm, initial encounter: Secondary | ICD-10-CM | POA: Diagnosis not present

## 2020-04-17 DIAGNOSIS — J301 Allergic rhinitis due to pollen: Secondary | ICD-10-CM | POA: Diagnosis not present

## 2020-05-01 DIAGNOSIS — J301 Allergic rhinitis due to pollen: Secondary | ICD-10-CM | POA: Diagnosis not present

## 2020-05-10 DIAGNOSIS — Z20822 Contact with and (suspected) exposure to covid-19: Secondary | ICD-10-CM | POA: Diagnosis not present

## 2020-05-10 DIAGNOSIS — J00 Acute nasopharyngitis [common cold]: Secondary | ICD-10-CM | POA: Diagnosis not present

## 2020-05-15 DIAGNOSIS — J301 Allergic rhinitis due to pollen: Secondary | ICD-10-CM | POA: Diagnosis not present

## 2020-05-17 DIAGNOSIS — J301 Allergic rhinitis due to pollen: Secondary | ICD-10-CM | POA: Diagnosis not present

## 2020-05-24 DIAGNOSIS — G4733 Obstructive sleep apnea (adult) (pediatric): Secondary | ICD-10-CM | POA: Diagnosis not present

## 2020-05-24 DIAGNOSIS — J4540 Moderate persistent asthma, uncomplicated: Secondary | ICD-10-CM | POA: Diagnosis not present

## 2020-05-24 DIAGNOSIS — I10 Essential (primary) hypertension: Secondary | ICD-10-CM | POA: Diagnosis not present

## 2020-05-24 DIAGNOSIS — R5383 Other fatigue: Secondary | ICD-10-CM | POA: Diagnosis not present

## 2020-05-24 DIAGNOSIS — J301 Allergic rhinitis due to pollen: Secondary | ICD-10-CM | POA: Diagnosis not present

## 2020-05-29 DIAGNOSIS — J301 Allergic rhinitis due to pollen: Secondary | ICD-10-CM | POA: Diagnosis not present

## 2020-06-07 DIAGNOSIS — Z139 Encounter for screening, unspecified: Secondary | ICD-10-CM | POA: Diagnosis not present

## 2020-06-07 DIAGNOSIS — Z0000 Encounter for general adult medical examination without abnormal findings: Secondary | ICD-10-CM | POA: Diagnosis not present

## 2020-06-07 DIAGNOSIS — Z9181 History of falling: Secondary | ICD-10-CM | POA: Diagnosis not present

## 2020-06-07 DIAGNOSIS — E785 Hyperlipidemia, unspecified: Secondary | ICD-10-CM | POA: Diagnosis not present

## 2020-06-12 DIAGNOSIS — S32020A Wedge compression fracture of second lumbar vertebra, initial encounter for closed fracture: Secondary | ICD-10-CM | POA: Diagnosis not present

## 2020-06-12 DIAGNOSIS — J301 Allergic rhinitis due to pollen: Secondary | ICD-10-CM | POA: Diagnosis not present

## 2020-06-12 DIAGNOSIS — M545 Low back pain, unspecified: Secondary | ICD-10-CM | POA: Diagnosis not present

## 2020-06-19 DIAGNOSIS — I10 Essential (primary) hypertension: Secondary | ICD-10-CM | POA: Diagnosis not present

## 2020-06-19 DIAGNOSIS — J208 Acute bronchitis due to other specified organisms: Secondary | ICD-10-CM | POA: Diagnosis not present

## 2020-06-19 DIAGNOSIS — B9689 Other specified bacterial agents as the cause of diseases classified elsewhere: Secondary | ICD-10-CM | POA: Diagnosis not present

## 2020-06-19 DIAGNOSIS — J019 Acute sinusitis, unspecified: Secondary | ICD-10-CM | POA: Diagnosis not present

## 2020-06-26 DIAGNOSIS — J301 Allergic rhinitis due to pollen: Secondary | ICD-10-CM | POA: Diagnosis not present

## 2020-07-05 DIAGNOSIS — I10 Essential (primary) hypertension: Secondary | ICD-10-CM | POA: Diagnosis not present

## 2020-07-10 DIAGNOSIS — J301 Allergic rhinitis due to pollen: Secondary | ICD-10-CM | POA: Diagnosis not present

## 2020-07-18 DIAGNOSIS — J301 Allergic rhinitis due to pollen: Secondary | ICD-10-CM | POA: Diagnosis not present

## 2020-07-19 DIAGNOSIS — I10 Essential (primary) hypertension: Secondary | ICD-10-CM | POA: Diagnosis not present

## 2020-08-07 DIAGNOSIS — J301 Allergic rhinitis due to pollen: Secondary | ICD-10-CM | POA: Diagnosis not present

## 2020-08-21 DIAGNOSIS — J301 Allergic rhinitis due to pollen: Secondary | ICD-10-CM | POA: Diagnosis not present

## 2020-08-23 DIAGNOSIS — I10 Essential (primary) hypertension: Secondary | ICD-10-CM | POA: Diagnosis not present

## 2020-08-23 DIAGNOSIS — E871 Hypo-osmolality and hyponatremia: Secondary | ICD-10-CM | POA: Diagnosis not present

## 2020-08-27 DIAGNOSIS — G4733 Obstructive sleep apnea (adult) (pediatric): Secondary | ICD-10-CM | POA: Diagnosis not present

## 2020-08-30 DIAGNOSIS — G4733 Obstructive sleep apnea (adult) (pediatric): Secondary | ICD-10-CM | POA: Diagnosis not present

## 2020-08-30 DIAGNOSIS — J301 Allergic rhinitis due to pollen: Secondary | ICD-10-CM | POA: Diagnosis not present

## 2020-08-30 DIAGNOSIS — J4540 Moderate persistent asthma, uncomplicated: Secondary | ICD-10-CM | POA: Diagnosis not present

## 2020-08-30 DIAGNOSIS — R5383 Other fatigue: Secondary | ICD-10-CM | POA: Diagnosis not present

## 2020-09-04 DIAGNOSIS — J301 Allergic rhinitis due to pollen: Secondary | ICD-10-CM | POA: Diagnosis not present

## 2020-09-17 DIAGNOSIS — M25551 Pain in right hip: Secondary | ICD-10-CM | POA: Diagnosis not present

## 2020-09-17 DIAGNOSIS — M7061 Trochanteric bursitis, right hip: Secondary | ICD-10-CM | POA: Diagnosis not present

## 2020-09-18 DIAGNOSIS — J301 Allergic rhinitis due to pollen: Secondary | ICD-10-CM | POA: Diagnosis not present

## 2020-10-02 DIAGNOSIS — J301 Allergic rhinitis due to pollen: Secondary | ICD-10-CM | POA: Diagnosis not present

## 2020-10-16 DIAGNOSIS — M810 Age-related osteoporosis without current pathological fracture: Secondary | ICD-10-CM | POA: Diagnosis not present

## 2020-10-16 DIAGNOSIS — J301 Allergic rhinitis due to pollen: Secondary | ICD-10-CM | POA: Diagnosis not present

## 2020-10-23 DIAGNOSIS — M810 Age-related osteoporosis without current pathological fracture: Secondary | ICD-10-CM | POA: Diagnosis not present

## 2020-10-30 DIAGNOSIS — J301 Allergic rhinitis due to pollen: Secondary | ICD-10-CM | POA: Diagnosis not present

## 2020-11-22 DIAGNOSIS — Z1231 Encounter for screening mammogram for malignant neoplasm of breast: Secondary | ICD-10-CM | POA: Diagnosis not present

## 2020-11-22 DIAGNOSIS — J4540 Moderate persistent asthma, uncomplicated: Secondary | ICD-10-CM | POA: Diagnosis not present

## 2020-11-22 DIAGNOSIS — J301 Allergic rhinitis due to pollen: Secondary | ICD-10-CM | POA: Diagnosis not present

## 2020-11-22 DIAGNOSIS — G4733 Obstructive sleep apnea (adult) (pediatric): Secondary | ICD-10-CM | POA: Diagnosis not present

## 2020-11-22 DIAGNOSIS — R5383 Other fatigue: Secondary | ICD-10-CM | POA: Diagnosis not present

## 2020-11-27 DIAGNOSIS — J301 Allergic rhinitis due to pollen: Secondary | ICD-10-CM | POA: Diagnosis not present

## 2020-12-11 DIAGNOSIS — J301 Allergic rhinitis due to pollen: Secondary | ICD-10-CM | POA: Diagnosis not present

## 2020-12-20 DIAGNOSIS — M7061 Trochanteric bursitis, right hip: Secondary | ICD-10-CM | POA: Diagnosis not present

## 2020-12-25 DIAGNOSIS — J301 Allergic rhinitis due to pollen: Secondary | ICD-10-CM | POA: Diagnosis not present

## 2021-01-10 DIAGNOSIS — J019 Acute sinusitis, unspecified: Secondary | ICD-10-CM | POA: Diagnosis not present

## 2021-01-10 DIAGNOSIS — B9689 Other specified bacterial agents as the cause of diseases classified elsewhere: Secondary | ICD-10-CM | POA: Diagnosis not present

## 2021-01-22 DIAGNOSIS — J301 Allergic rhinitis due to pollen: Secondary | ICD-10-CM | POA: Diagnosis not present

## 2021-02-05 DIAGNOSIS — J301 Allergic rhinitis due to pollen: Secondary | ICD-10-CM | POA: Diagnosis not present

## 2021-02-05 DIAGNOSIS — Z23 Encounter for immunization: Secondary | ICD-10-CM | POA: Diagnosis not present

## 2021-02-05 DIAGNOSIS — Z79899 Other long term (current) drug therapy: Secondary | ICD-10-CM | POA: Diagnosis not present

## 2021-02-05 DIAGNOSIS — I10 Essential (primary) hypertension: Secondary | ICD-10-CM | POA: Diagnosis not present

## 2021-02-05 DIAGNOSIS — T466X5A Adverse effect of antihyperlipidemic and antiarteriosclerotic drugs, initial encounter: Secondary | ICD-10-CM | POA: Diagnosis not present

## 2021-02-05 DIAGNOSIS — J4540 Moderate persistent asthma, uncomplicated: Secondary | ICD-10-CM | POA: Diagnosis not present

## 2021-02-05 DIAGNOSIS — G4733 Obstructive sleep apnea (adult) (pediatric): Secondary | ICD-10-CM | POA: Diagnosis not present

## 2021-02-05 DIAGNOSIS — R5383 Other fatigue: Secondary | ICD-10-CM | POA: Diagnosis not present

## 2021-02-05 DIAGNOSIS — D519 Vitamin B12 deficiency anemia, unspecified: Secondary | ICD-10-CM | POA: Diagnosis not present

## 2021-02-05 DIAGNOSIS — G720 Drug-induced myopathy: Secondary | ICD-10-CM | POA: Diagnosis not present

## 2021-02-06 DIAGNOSIS — D519 Vitamin B12 deficiency anemia, unspecified: Secondary | ICD-10-CM | POA: Diagnosis not present

## 2021-02-19 DIAGNOSIS — J301 Allergic rhinitis due to pollen: Secondary | ICD-10-CM | POA: Diagnosis not present

## 2021-02-22 DIAGNOSIS — J208 Acute bronchitis due to other specified organisms: Secondary | ICD-10-CM | POA: Diagnosis not present

## 2021-02-22 DIAGNOSIS — B9689 Other specified bacterial agents as the cause of diseases classified elsewhere: Secondary | ICD-10-CM | POA: Diagnosis not present

## 2021-03-05 DIAGNOSIS — J301 Allergic rhinitis due to pollen: Secondary | ICD-10-CM | POA: Diagnosis not present

## 2021-03-18 DIAGNOSIS — N390 Urinary tract infection, site not specified: Secondary | ICD-10-CM | POA: Diagnosis not present

## 2021-03-18 DIAGNOSIS — R600 Localized edema: Secondary | ICD-10-CM | POA: Diagnosis not present

## 2021-03-19 DIAGNOSIS — J301 Allergic rhinitis due to pollen: Secondary | ICD-10-CM | POA: Diagnosis not present

## 2021-03-28 DIAGNOSIS — J301 Allergic rhinitis due to pollen: Secondary | ICD-10-CM | POA: Diagnosis not present

## 2021-03-28 DIAGNOSIS — J4540 Moderate persistent asthma, uncomplicated: Secondary | ICD-10-CM | POA: Diagnosis not present

## 2021-03-28 DIAGNOSIS — G4733 Obstructive sleep apnea (adult) (pediatric): Secondary | ICD-10-CM | POA: Diagnosis not present

## 2021-03-28 DIAGNOSIS — R5383 Other fatigue: Secondary | ICD-10-CM | POA: Diagnosis not present

## 2021-04-09 DIAGNOSIS — J301 Allergic rhinitis due to pollen: Secondary | ICD-10-CM | POA: Diagnosis not present

## 2021-04-12 DIAGNOSIS — J9811 Atelectasis: Secondary | ICD-10-CM | POA: Diagnosis not present

## 2021-04-12 DIAGNOSIS — M25552 Pain in left hip: Secondary | ICD-10-CM | POA: Diagnosis not present

## 2021-04-12 DIAGNOSIS — Z743 Need for continuous supervision: Secondary | ICD-10-CM | POA: Diagnosis not present

## 2021-04-12 DIAGNOSIS — S72002A Fracture of unspecified part of neck of left femur, initial encounter for closed fracture: Secondary | ICD-10-CM | POA: Diagnosis not present

## 2021-04-12 DIAGNOSIS — Z96611 Presence of right artificial shoulder joint: Secondary | ICD-10-CM | POA: Diagnosis not present

## 2021-04-12 DIAGNOSIS — R710 Precipitous drop in hematocrit: Secondary | ICD-10-CM | POA: Diagnosis not present

## 2021-04-12 DIAGNOSIS — N1330 Unspecified hydronephrosis: Secondary | ICD-10-CM | POA: Diagnosis not present

## 2021-04-12 DIAGNOSIS — R6889 Other general symptoms and signs: Secondary | ICD-10-CM | POA: Diagnosis not present

## 2021-04-12 DIAGNOSIS — M9702XA Periprosthetic fracture around internal prosthetic left hip joint, initial encounter: Secondary | ICD-10-CM | POA: Diagnosis not present

## 2021-04-12 DIAGNOSIS — R0902 Hypoxemia: Secondary | ICD-10-CM | POA: Diagnosis not present

## 2021-04-12 DIAGNOSIS — D62 Acute posthemorrhagic anemia: Secondary | ICD-10-CM | POA: Diagnosis not present

## 2021-04-12 DIAGNOSIS — S72302A Unspecified fracture of shaft of left femur, initial encounter for closed fracture: Secondary | ICD-10-CM | POA: Diagnosis not present

## 2021-04-12 DIAGNOSIS — W19XXXA Unspecified fall, initial encounter: Secondary | ICD-10-CM | POA: Diagnosis not present

## 2021-04-12 DIAGNOSIS — M9689 Other intraoperative and postprocedural complications and disorders of the musculoskeletal system: Secondary | ICD-10-CM | POA: Diagnosis not present

## 2021-04-12 DIAGNOSIS — Z9889 Other specified postprocedural states: Secondary | ICD-10-CM | POA: Diagnosis not present

## 2021-04-12 DIAGNOSIS — I454 Nonspecific intraventricular block: Secondary | ICD-10-CM | POA: Diagnosis not present

## 2021-04-12 DIAGNOSIS — M810 Age-related osteoporosis without current pathological fracture: Secondary | ICD-10-CM | POA: Diagnosis not present

## 2021-04-12 DIAGNOSIS — I10 Essential (primary) hypertension: Secondary | ICD-10-CM | POA: Diagnosis not present

## 2021-04-12 DIAGNOSIS — Z66 Do not resuscitate: Secondary | ICD-10-CM | POA: Diagnosis not present

## 2021-04-12 DIAGNOSIS — Z01818 Encounter for other preprocedural examination: Secondary | ICD-10-CM | POA: Diagnosis not present

## 2021-04-12 DIAGNOSIS — K449 Diaphragmatic hernia without obstruction or gangrene: Secondary | ICD-10-CM | POA: Diagnosis not present

## 2021-04-12 DIAGNOSIS — Z96653 Presence of artificial knee joint, bilateral: Secondary | ICD-10-CM | POA: Diagnosis not present

## 2021-04-12 DIAGNOSIS — K5790 Diverticulosis of intestine, part unspecified, without perforation or abscess without bleeding: Secondary | ICD-10-CM | POA: Diagnosis not present

## 2021-04-12 DIAGNOSIS — N3289 Other specified disorders of bladder: Secondary | ICD-10-CM | POA: Diagnosis not present

## 2021-04-12 DIAGNOSIS — D649 Anemia, unspecified: Secondary | ICD-10-CM | POA: Diagnosis not present

## 2021-04-12 DIAGNOSIS — J449 Chronic obstructive pulmonary disease, unspecified: Secondary | ICD-10-CM | POA: Diagnosis not present

## 2021-04-12 DIAGNOSIS — J45909 Unspecified asthma, uncomplicated: Secondary | ICD-10-CM | POA: Diagnosis not present

## 2021-04-12 DIAGNOSIS — Y998 Other external cause status: Secondary | ICD-10-CM | POA: Diagnosis not present

## 2021-04-12 DIAGNOSIS — Z96642 Presence of left artificial hip joint: Secondary | ICD-10-CM | POA: Diagnosis not present

## 2021-04-12 DIAGNOSIS — R000 Tachycardia, unspecified: Secondary | ICD-10-CM | POA: Diagnosis not present

## 2021-04-12 DIAGNOSIS — W1839XA Other fall on same level, initial encounter: Secondary | ICD-10-CM | POA: Diagnosis not present

## 2021-04-23 DIAGNOSIS — D62 Acute posthemorrhagic anemia: Secondary | ICD-10-CM | POA: Diagnosis not present

## 2021-04-23 DIAGNOSIS — Z299 Encounter for prophylactic measures, unspecified: Secondary | ICD-10-CM | POA: Diagnosis not present

## 2021-04-23 DIAGNOSIS — I10 Essential (primary) hypertension: Secondary | ICD-10-CM | POA: Diagnosis not present

## 2021-04-23 DIAGNOSIS — M25461 Effusion, right knee: Secondary | ICD-10-CM | POA: Diagnosis not present

## 2021-04-23 DIAGNOSIS — D649 Anemia, unspecified: Secondary | ICD-10-CM | POA: Diagnosis not present

## 2021-04-23 DIAGNOSIS — R000 Tachycardia, unspecified: Secondary | ICD-10-CM | POA: Diagnosis not present

## 2021-04-23 DIAGNOSIS — E871 Hypo-osmolality and hyponatremia: Secondary | ICD-10-CM | POA: Diagnosis not present

## 2021-04-23 DIAGNOSIS — S72002A Fracture of unspecified part of neck of left femur, initial encounter for closed fracture: Secondary | ICD-10-CM | POA: Diagnosis not present

## 2021-04-23 DIAGNOSIS — Z8781 Personal history of (healed) traumatic fracture: Secondary | ICD-10-CM | POA: Diagnosis not present

## 2021-04-25 DIAGNOSIS — R7989 Other specified abnormal findings of blood chemistry: Secondary | ICD-10-CM | POA: Diagnosis not present

## 2021-04-25 DIAGNOSIS — D649 Anemia, unspecified: Secondary | ICD-10-CM | POA: Diagnosis not present

## 2021-04-25 DIAGNOSIS — S72002A Fracture of unspecified part of neck of left femur, initial encounter for closed fracture: Secondary | ICD-10-CM | POA: Diagnosis not present

## 2021-04-25 DIAGNOSIS — E871 Hypo-osmolality and hyponatremia: Secondary | ICD-10-CM | POA: Diagnosis not present

## 2021-04-25 DIAGNOSIS — Z96642 Presence of left artificial hip joint: Secondary | ICD-10-CM | POA: Diagnosis not present

## 2021-05-02 DIAGNOSIS — M545 Low back pain, unspecified: Secondary | ICD-10-CM | POA: Diagnosis not present

## 2021-05-02 DIAGNOSIS — Z7951 Long term (current) use of inhaled steroids: Secondary | ICD-10-CM | POA: Diagnosis not present

## 2021-05-02 DIAGNOSIS — Z96611 Presence of right artificial shoulder joint: Secondary | ICD-10-CM | POA: Diagnosis not present

## 2021-05-02 DIAGNOSIS — F32A Depression, unspecified: Secondary | ICD-10-CM | POA: Diagnosis not present

## 2021-05-02 DIAGNOSIS — D649 Anemia, unspecified: Secondary | ICD-10-CM | POA: Diagnosis not present

## 2021-05-02 DIAGNOSIS — I10 Essential (primary) hypertension: Secondary | ICD-10-CM | POA: Diagnosis not present

## 2021-05-02 DIAGNOSIS — Z9181 History of falling: Secondary | ICD-10-CM | POA: Diagnosis not present

## 2021-05-02 DIAGNOSIS — Z7982 Long term (current) use of aspirin: Secondary | ICD-10-CM | POA: Diagnosis not present

## 2021-05-02 DIAGNOSIS — Z96653 Presence of artificial knee joint, bilateral: Secondary | ICD-10-CM | POA: Diagnosis not present

## 2021-05-02 DIAGNOSIS — M810 Age-related osteoporosis without current pathological fracture: Secondary | ICD-10-CM | POA: Diagnosis not present

## 2021-05-02 DIAGNOSIS — M7150 Other bursitis, not elsewhere classified, unspecified site: Secondary | ICD-10-CM | POA: Diagnosis not present

## 2021-05-02 DIAGNOSIS — Z87440 Personal history of urinary (tract) infections: Secondary | ICD-10-CM | POA: Diagnosis not present

## 2021-05-02 DIAGNOSIS — Z96642 Presence of left artificial hip joint: Secondary | ICD-10-CM | POA: Diagnosis not present

## 2021-05-02 DIAGNOSIS — S72002D Fracture of unspecified part of neck of left femur, subsequent encounter for closed fracture with routine healing: Secondary | ICD-10-CM | POA: Diagnosis not present

## 2021-05-02 DIAGNOSIS — M2010 Hallux valgus (acquired), unspecified foot: Secondary | ICD-10-CM | POA: Diagnosis not present

## 2021-05-02 DIAGNOSIS — J449 Chronic obstructive pulmonary disease, unspecified: Secondary | ICD-10-CM | POA: Diagnosis not present

## 2021-05-04 DIAGNOSIS — Z87440 Personal history of urinary (tract) infections: Secondary | ICD-10-CM | POA: Diagnosis not present

## 2021-05-04 DIAGNOSIS — I10 Essential (primary) hypertension: Secondary | ICD-10-CM | POA: Diagnosis not present

## 2021-05-04 DIAGNOSIS — Z7951 Long term (current) use of inhaled steroids: Secondary | ICD-10-CM | POA: Diagnosis not present

## 2021-05-04 DIAGNOSIS — Z7982 Long term (current) use of aspirin: Secondary | ICD-10-CM | POA: Diagnosis not present

## 2021-05-04 DIAGNOSIS — M545 Low back pain, unspecified: Secondary | ICD-10-CM | POA: Diagnosis not present

## 2021-05-04 DIAGNOSIS — M7150 Other bursitis, not elsewhere classified, unspecified site: Secondary | ICD-10-CM | POA: Diagnosis not present

## 2021-05-04 DIAGNOSIS — S72002D Fracture of unspecified part of neck of left femur, subsequent encounter for closed fracture with routine healing: Secondary | ICD-10-CM | POA: Diagnosis not present

## 2021-05-04 DIAGNOSIS — Z96611 Presence of right artificial shoulder joint: Secondary | ICD-10-CM | POA: Diagnosis not present

## 2021-05-04 DIAGNOSIS — M810 Age-related osteoporosis without current pathological fracture: Secondary | ICD-10-CM | POA: Diagnosis not present

## 2021-05-04 DIAGNOSIS — J449 Chronic obstructive pulmonary disease, unspecified: Secondary | ICD-10-CM | POA: Diagnosis not present

## 2021-05-04 DIAGNOSIS — F32A Depression, unspecified: Secondary | ICD-10-CM | POA: Diagnosis not present

## 2021-05-04 DIAGNOSIS — M2010 Hallux valgus (acquired), unspecified foot: Secondary | ICD-10-CM | POA: Diagnosis not present

## 2021-05-04 DIAGNOSIS — Z96642 Presence of left artificial hip joint: Secondary | ICD-10-CM | POA: Diagnosis not present

## 2021-05-04 DIAGNOSIS — D649 Anemia, unspecified: Secondary | ICD-10-CM | POA: Diagnosis not present

## 2021-05-04 DIAGNOSIS — Z96653 Presence of artificial knee joint, bilateral: Secondary | ICD-10-CM | POA: Diagnosis not present

## 2021-05-04 DIAGNOSIS — Z9181 History of falling: Secondary | ICD-10-CM | POA: Diagnosis not present

## 2021-05-06 DIAGNOSIS — Z9181 History of falling: Secondary | ICD-10-CM | POA: Diagnosis not present

## 2021-05-06 DIAGNOSIS — J449 Chronic obstructive pulmonary disease, unspecified: Secondary | ICD-10-CM | POA: Diagnosis not present

## 2021-05-06 DIAGNOSIS — F32A Depression, unspecified: Secondary | ICD-10-CM | POA: Diagnosis not present

## 2021-05-06 DIAGNOSIS — S72002D Fracture of unspecified part of neck of left femur, subsequent encounter for closed fracture with routine healing: Secondary | ICD-10-CM | POA: Diagnosis not present

## 2021-05-06 DIAGNOSIS — M810 Age-related osteoporosis without current pathological fracture: Secondary | ICD-10-CM | POA: Diagnosis not present

## 2021-05-06 DIAGNOSIS — M2010 Hallux valgus (acquired), unspecified foot: Secondary | ICD-10-CM | POA: Diagnosis not present

## 2021-05-06 DIAGNOSIS — D649 Anemia, unspecified: Secondary | ICD-10-CM | POA: Diagnosis not present

## 2021-05-06 DIAGNOSIS — M7150 Other bursitis, not elsewhere classified, unspecified site: Secondary | ICD-10-CM | POA: Diagnosis not present

## 2021-05-06 DIAGNOSIS — Z7951 Long term (current) use of inhaled steroids: Secondary | ICD-10-CM | POA: Diagnosis not present

## 2021-05-06 DIAGNOSIS — Z87440 Personal history of urinary (tract) infections: Secondary | ICD-10-CM | POA: Diagnosis not present

## 2021-05-06 DIAGNOSIS — Z7982 Long term (current) use of aspirin: Secondary | ICD-10-CM | POA: Diagnosis not present

## 2021-05-06 DIAGNOSIS — Z96653 Presence of artificial knee joint, bilateral: Secondary | ICD-10-CM | POA: Diagnosis not present

## 2021-05-06 DIAGNOSIS — Z96642 Presence of left artificial hip joint: Secondary | ICD-10-CM | POA: Diagnosis not present

## 2021-05-06 DIAGNOSIS — I10 Essential (primary) hypertension: Secondary | ICD-10-CM | POA: Diagnosis not present

## 2021-05-06 DIAGNOSIS — M545 Low back pain, unspecified: Secondary | ICD-10-CM | POA: Diagnosis not present

## 2021-05-06 DIAGNOSIS — Z96611 Presence of right artificial shoulder joint: Secondary | ICD-10-CM | POA: Diagnosis not present

## 2021-05-08 DIAGNOSIS — Z9181 History of falling: Secondary | ICD-10-CM | POA: Diagnosis not present

## 2021-05-08 DIAGNOSIS — D649 Anemia, unspecified: Secondary | ICD-10-CM | POA: Diagnosis not present

## 2021-05-08 DIAGNOSIS — J449 Chronic obstructive pulmonary disease, unspecified: Secondary | ICD-10-CM | POA: Diagnosis not present

## 2021-05-08 DIAGNOSIS — Z96653 Presence of artificial knee joint, bilateral: Secondary | ICD-10-CM | POA: Diagnosis not present

## 2021-05-08 DIAGNOSIS — M2010 Hallux valgus (acquired), unspecified foot: Secondary | ICD-10-CM | POA: Diagnosis not present

## 2021-05-08 DIAGNOSIS — Z7982 Long term (current) use of aspirin: Secondary | ICD-10-CM | POA: Diagnosis not present

## 2021-05-08 DIAGNOSIS — M545 Low back pain, unspecified: Secondary | ICD-10-CM | POA: Diagnosis not present

## 2021-05-08 DIAGNOSIS — Z96642 Presence of left artificial hip joint: Secondary | ICD-10-CM | POA: Diagnosis not present

## 2021-05-08 DIAGNOSIS — S72002D Fracture of unspecified part of neck of left femur, subsequent encounter for closed fracture with routine healing: Secondary | ICD-10-CM | POA: Diagnosis not present

## 2021-05-08 DIAGNOSIS — I10 Essential (primary) hypertension: Secondary | ICD-10-CM | POA: Diagnosis not present

## 2021-05-08 DIAGNOSIS — M7150 Other bursitis, not elsewhere classified, unspecified site: Secondary | ICD-10-CM | POA: Diagnosis not present

## 2021-05-08 DIAGNOSIS — M810 Age-related osteoporosis without current pathological fracture: Secondary | ICD-10-CM | POA: Diagnosis not present

## 2021-05-08 DIAGNOSIS — Z87440 Personal history of urinary (tract) infections: Secondary | ICD-10-CM | POA: Diagnosis not present

## 2021-05-08 DIAGNOSIS — Z96611 Presence of right artificial shoulder joint: Secondary | ICD-10-CM | POA: Diagnosis not present

## 2021-05-08 DIAGNOSIS — F32A Depression, unspecified: Secondary | ICD-10-CM | POA: Diagnosis not present

## 2021-05-08 DIAGNOSIS — Z7951 Long term (current) use of inhaled steroids: Secondary | ICD-10-CM | POA: Diagnosis not present

## 2021-05-10 DIAGNOSIS — T8189XA Other complications of procedures, not elsewhere classified, initial encounter: Secondary | ICD-10-CM | POA: Diagnosis not present

## 2021-05-10 DIAGNOSIS — S81812A Laceration without foreign body, left lower leg, initial encounter: Secondary | ICD-10-CM | POA: Diagnosis not present

## 2021-05-14 DIAGNOSIS — Z87440 Personal history of urinary (tract) infections: Secondary | ICD-10-CM | POA: Diagnosis not present

## 2021-05-14 DIAGNOSIS — Z96653 Presence of artificial knee joint, bilateral: Secondary | ICD-10-CM | POA: Diagnosis not present

## 2021-05-14 DIAGNOSIS — Z7951 Long term (current) use of inhaled steroids: Secondary | ICD-10-CM | POA: Diagnosis not present

## 2021-05-14 DIAGNOSIS — M7150 Other bursitis, not elsewhere classified, unspecified site: Secondary | ICD-10-CM | POA: Diagnosis not present

## 2021-05-14 DIAGNOSIS — F32A Depression, unspecified: Secondary | ICD-10-CM | POA: Diagnosis not present

## 2021-05-14 DIAGNOSIS — Z96642 Presence of left artificial hip joint: Secondary | ICD-10-CM | POA: Diagnosis not present

## 2021-05-14 DIAGNOSIS — I10 Essential (primary) hypertension: Secondary | ICD-10-CM | POA: Diagnosis not present

## 2021-05-14 DIAGNOSIS — M2010 Hallux valgus (acquired), unspecified foot: Secondary | ICD-10-CM | POA: Diagnosis not present

## 2021-05-14 DIAGNOSIS — M545 Low back pain, unspecified: Secondary | ICD-10-CM | POA: Diagnosis not present

## 2021-05-14 DIAGNOSIS — J449 Chronic obstructive pulmonary disease, unspecified: Secondary | ICD-10-CM | POA: Diagnosis not present

## 2021-05-14 DIAGNOSIS — Z7982 Long term (current) use of aspirin: Secondary | ICD-10-CM | POA: Diagnosis not present

## 2021-05-14 DIAGNOSIS — D649 Anemia, unspecified: Secondary | ICD-10-CM | POA: Diagnosis not present

## 2021-05-14 DIAGNOSIS — M810 Age-related osteoporosis without current pathological fracture: Secondary | ICD-10-CM | POA: Diagnosis not present

## 2021-05-14 DIAGNOSIS — S72002D Fracture of unspecified part of neck of left femur, subsequent encounter for closed fracture with routine healing: Secondary | ICD-10-CM | POA: Diagnosis not present

## 2021-05-14 DIAGNOSIS — Z96611 Presence of right artificial shoulder joint: Secondary | ICD-10-CM | POA: Diagnosis not present

## 2021-05-14 DIAGNOSIS — Z9181 History of falling: Secondary | ICD-10-CM | POA: Diagnosis not present

## 2021-05-15 DIAGNOSIS — Z7982 Long term (current) use of aspirin: Secondary | ICD-10-CM | POA: Diagnosis not present

## 2021-05-15 DIAGNOSIS — Z9181 History of falling: Secondary | ICD-10-CM | POA: Diagnosis not present

## 2021-05-15 DIAGNOSIS — Z96653 Presence of artificial knee joint, bilateral: Secondary | ICD-10-CM | POA: Diagnosis not present

## 2021-05-15 DIAGNOSIS — S72002D Fracture of unspecified part of neck of left femur, subsequent encounter for closed fracture with routine healing: Secondary | ICD-10-CM | POA: Diagnosis not present

## 2021-05-15 DIAGNOSIS — M7150 Other bursitis, not elsewhere classified, unspecified site: Secondary | ICD-10-CM | POA: Diagnosis not present

## 2021-05-15 DIAGNOSIS — M2010 Hallux valgus (acquired), unspecified foot: Secondary | ICD-10-CM | POA: Diagnosis not present

## 2021-05-15 DIAGNOSIS — Z87440 Personal history of urinary (tract) infections: Secondary | ICD-10-CM | POA: Diagnosis not present

## 2021-05-15 DIAGNOSIS — J449 Chronic obstructive pulmonary disease, unspecified: Secondary | ICD-10-CM | POA: Diagnosis not present

## 2021-05-15 DIAGNOSIS — M545 Low back pain, unspecified: Secondary | ICD-10-CM | POA: Diagnosis not present

## 2021-05-15 DIAGNOSIS — Z96642 Presence of left artificial hip joint: Secondary | ICD-10-CM | POA: Diagnosis not present

## 2021-05-15 DIAGNOSIS — I10 Essential (primary) hypertension: Secondary | ICD-10-CM | POA: Diagnosis not present

## 2021-05-15 DIAGNOSIS — D649 Anemia, unspecified: Secondary | ICD-10-CM | POA: Diagnosis not present

## 2021-05-15 DIAGNOSIS — Z7951 Long term (current) use of inhaled steroids: Secondary | ICD-10-CM | POA: Diagnosis not present

## 2021-05-15 DIAGNOSIS — F32A Depression, unspecified: Secondary | ICD-10-CM | POA: Diagnosis not present

## 2021-05-15 DIAGNOSIS — Z96611 Presence of right artificial shoulder joint: Secondary | ICD-10-CM | POA: Diagnosis not present

## 2021-05-15 DIAGNOSIS — M810 Age-related osteoporosis without current pathological fracture: Secondary | ICD-10-CM | POA: Diagnosis not present

## 2021-05-16 DIAGNOSIS — Z96642 Presence of left artificial hip joint: Secondary | ICD-10-CM | POA: Diagnosis not present

## 2021-05-16 DIAGNOSIS — Z7982 Long term (current) use of aspirin: Secondary | ICD-10-CM | POA: Diagnosis not present

## 2021-05-16 DIAGNOSIS — Z96653 Presence of artificial knee joint, bilateral: Secondary | ICD-10-CM | POA: Diagnosis not present

## 2021-05-16 DIAGNOSIS — D649 Anemia, unspecified: Secondary | ICD-10-CM | POA: Diagnosis not present

## 2021-05-16 DIAGNOSIS — M810 Age-related osteoporosis without current pathological fracture: Secondary | ICD-10-CM | POA: Diagnosis not present

## 2021-05-16 DIAGNOSIS — I10 Essential (primary) hypertension: Secondary | ICD-10-CM | POA: Diagnosis not present

## 2021-05-16 DIAGNOSIS — Z9181 History of falling: Secondary | ICD-10-CM | POA: Diagnosis not present

## 2021-05-16 DIAGNOSIS — Z7951 Long term (current) use of inhaled steroids: Secondary | ICD-10-CM | POA: Diagnosis not present

## 2021-05-16 DIAGNOSIS — M2010 Hallux valgus (acquired), unspecified foot: Secondary | ICD-10-CM | POA: Diagnosis not present

## 2021-05-16 DIAGNOSIS — F32A Depression, unspecified: Secondary | ICD-10-CM | POA: Diagnosis not present

## 2021-05-16 DIAGNOSIS — S72002D Fracture of unspecified part of neck of left femur, subsequent encounter for closed fracture with routine healing: Secondary | ICD-10-CM | POA: Diagnosis not present

## 2021-05-16 DIAGNOSIS — Z96611 Presence of right artificial shoulder joint: Secondary | ICD-10-CM | POA: Diagnosis not present

## 2021-05-16 DIAGNOSIS — M545 Low back pain, unspecified: Secondary | ICD-10-CM | POA: Diagnosis not present

## 2021-05-16 DIAGNOSIS — Z87440 Personal history of urinary (tract) infections: Secondary | ICD-10-CM | POA: Diagnosis not present

## 2021-05-16 DIAGNOSIS — M7150 Other bursitis, not elsewhere classified, unspecified site: Secondary | ICD-10-CM | POA: Diagnosis not present

## 2021-05-16 DIAGNOSIS — J449 Chronic obstructive pulmonary disease, unspecified: Secondary | ICD-10-CM | POA: Diagnosis not present

## 2021-05-18 DIAGNOSIS — S72002A Fracture of unspecified part of neck of left femur, initial encounter for closed fracture: Secondary | ICD-10-CM | POA: Diagnosis not present

## 2021-05-20 DIAGNOSIS — M2010 Hallux valgus (acquired), unspecified foot: Secondary | ICD-10-CM | POA: Diagnosis not present

## 2021-05-20 DIAGNOSIS — Z96611 Presence of right artificial shoulder joint: Secondary | ICD-10-CM | POA: Diagnosis not present

## 2021-05-20 DIAGNOSIS — F32A Depression, unspecified: Secondary | ICD-10-CM | POA: Diagnosis not present

## 2021-05-20 DIAGNOSIS — M545 Low back pain, unspecified: Secondary | ICD-10-CM | POA: Diagnosis not present

## 2021-05-20 DIAGNOSIS — M7150 Other bursitis, not elsewhere classified, unspecified site: Secondary | ICD-10-CM | POA: Diagnosis not present

## 2021-05-20 DIAGNOSIS — Z96653 Presence of artificial knee joint, bilateral: Secondary | ICD-10-CM | POA: Diagnosis not present

## 2021-05-20 DIAGNOSIS — Z87440 Personal history of urinary (tract) infections: Secondary | ICD-10-CM | POA: Diagnosis not present

## 2021-05-20 DIAGNOSIS — Z7951 Long term (current) use of inhaled steroids: Secondary | ICD-10-CM | POA: Diagnosis not present

## 2021-05-20 DIAGNOSIS — M810 Age-related osteoporosis without current pathological fracture: Secondary | ICD-10-CM | POA: Diagnosis not present

## 2021-05-20 DIAGNOSIS — D649 Anemia, unspecified: Secondary | ICD-10-CM | POA: Diagnosis not present

## 2021-05-20 DIAGNOSIS — S72002D Fracture of unspecified part of neck of left femur, subsequent encounter for closed fracture with routine healing: Secondary | ICD-10-CM | POA: Diagnosis not present

## 2021-05-20 DIAGNOSIS — Z7982 Long term (current) use of aspirin: Secondary | ICD-10-CM | POA: Diagnosis not present

## 2021-05-20 DIAGNOSIS — Z9181 History of falling: Secondary | ICD-10-CM | POA: Diagnosis not present

## 2021-05-20 DIAGNOSIS — I10 Essential (primary) hypertension: Secondary | ICD-10-CM | POA: Diagnosis not present

## 2021-05-20 DIAGNOSIS — J449 Chronic obstructive pulmonary disease, unspecified: Secondary | ICD-10-CM | POA: Diagnosis not present

## 2021-05-20 DIAGNOSIS — Z96642 Presence of left artificial hip joint: Secondary | ICD-10-CM | POA: Diagnosis not present

## 2021-05-22 DIAGNOSIS — J449 Chronic obstructive pulmonary disease, unspecified: Secondary | ICD-10-CM | POA: Diagnosis not present

## 2021-05-22 DIAGNOSIS — Z96653 Presence of artificial knee joint, bilateral: Secondary | ICD-10-CM | POA: Diagnosis not present

## 2021-05-22 DIAGNOSIS — F32A Depression, unspecified: Secondary | ICD-10-CM | POA: Diagnosis not present

## 2021-05-22 DIAGNOSIS — Z7982 Long term (current) use of aspirin: Secondary | ICD-10-CM | POA: Diagnosis not present

## 2021-05-22 DIAGNOSIS — Z96611 Presence of right artificial shoulder joint: Secondary | ICD-10-CM | POA: Diagnosis not present

## 2021-05-22 DIAGNOSIS — M2010 Hallux valgus (acquired), unspecified foot: Secondary | ICD-10-CM | POA: Diagnosis not present

## 2021-05-22 DIAGNOSIS — Z7951 Long term (current) use of inhaled steroids: Secondary | ICD-10-CM | POA: Diagnosis not present

## 2021-05-22 DIAGNOSIS — M7150 Other bursitis, not elsewhere classified, unspecified site: Secondary | ICD-10-CM | POA: Diagnosis not present

## 2021-05-22 DIAGNOSIS — Z87440 Personal history of urinary (tract) infections: Secondary | ICD-10-CM | POA: Diagnosis not present

## 2021-05-22 DIAGNOSIS — I10 Essential (primary) hypertension: Secondary | ICD-10-CM | POA: Diagnosis not present

## 2021-05-22 DIAGNOSIS — S72002D Fracture of unspecified part of neck of left femur, subsequent encounter for closed fracture with routine healing: Secondary | ICD-10-CM | POA: Diagnosis not present

## 2021-05-22 DIAGNOSIS — D649 Anemia, unspecified: Secondary | ICD-10-CM | POA: Diagnosis not present

## 2021-05-22 DIAGNOSIS — M545 Low back pain, unspecified: Secondary | ICD-10-CM | POA: Diagnosis not present

## 2021-05-22 DIAGNOSIS — Z96642 Presence of left artificial hip joint: Secondary | ICD-10-CM | POA: Diagnosis not present

## 2021-05-22 DIAGNOSIS — Z9181 History of falling: Secondary | ICD-10-CM | POA: Diagnosis not present

## 2021-05-22 DIAGNOSIS — M810 Age-related osteoporosis without current pathological fracture: Secondary | ICD-10-CM | POA: Diagnosis not present

## 2021-05-24 DIAGNOSIS — T8189XA Other complications of procedures, not elsewhere classified, initial encounter: Secondary | ICD-10-CM | POA: Diagnosis not present

## 2021-05-24 DIAGNOSIS — S72302D Unspecified fracture of shaft of left femur, subsequent encounter for closed fracture with routine healing: Secondary | ICD-10-CM | POA: Diagnosis not present

## 2021-05-27 DIAGNOSIS — D649 Anemia, unspecified: Secondary | ICD-10-CM | POA: Diagnosis not present

## 2021-05-27 DIAGNOSIS — F32A Depression, unspecified: Secondary | ICD-10-CM | POA: Diagnosis not present

## 2021-05-27 DIAGNOSIS — Z96642 Presence of left artificial hip joint: Secondary | ICD-10-CM | POA: Diagnosis not present

## 2021-05-27 DIAGNOSIS — M7150 Other bursitis, not elsewhere classified, unspecified site: Secondary | ICD-10-CM | POA: Diagnosis not present

## 2021-05-27 DIAGNOSIS — Z96611 Presence of right artificial shoulder joint: Secondary | ICD-10-CM | POA: Diagnosis not present

## 2021-05-27 DIAGNOSIS — I10 Essential (primary) hypertension: Secondary | ICD-10-CM | POA: Diagnosis not present

## 2021-05-27 DIAGNOSIS — Z87440 Personal history of urinary (tract) infections: Secondary | ICD-10-CM | POA: Diagnosis not present

## 2021-05-27 DIAGNOSIS — Z7951 Long term (current) use of inhaled steroids: Secondary | ICD-10-CM | POA: Diagnosis not present

## 2021-05-27 DIAGNOSIS — Z9181 History of falling: Secondary | ICD-10-CM | POA: Diagnosis not present

## 2021-05-27 DIAGNOSIS — J449 Chronic obstructive pulmonary disease, unspecified: Secondary | ICD-10-CM | POA: Diagnosis not present

## 2021-05-27 DIAGNOSIS — S72002D Fracture of unspecified part of neck of left femur, subsequent encounter for closed fracture with routine healing: Secondary | ICD-10-CM | POA: Diagnosis not present

## 2021-05-27 DIAGNOSIS — M2010 Hallux valgus (acquired), unspecified foot: Secondary | ICD-10-CM | POA: Diagnosis not present

## 2021-05-27 DIAGNOSIS — M810 Age-related osteoporosis without current pathological fracture: Secondary | ICD-10-CM | POA: Diagnosis not present

## 2021-05-27 DIAGNOSIS — M545 Low back pain, unspecified: Secondary | ICD-10-CM | POA: Diagnosis not present

## 2021-05-27 DIAGNOSIS — Z7982 Long term (current) use of aspirin: Secondary | ICD-10-CM | POA: Diagnosis not present

## 2021-05-27 DIAGNOSIS — Z96653 Presence of artificial knee joint, bilateral: Secondary | ICD-10-CM | POA: Diagnosis not present

## 2021-05-29 DIAGNOSIS — Z87440 Personal history of urinary (tract) infections: Secondary | ICD-10-CM | POA: Diagnosis not present

## 2021-05-29 DIAGNOSIS — Z96611 Presence of right artificial shoulder joint: Secondary | ICD-10-CM | POA: Diagnosis not present

## 2021-05-29 DIAGNOSIS — Z9181 History of falling: Secondary | ICD-10-CM | POA: Diagnosis not present

## 2021-05-29 DIAGNOSIS — I10 Essential (primary) hypertension: Secondary | ICD-10-CM | POA: Diagnosis not present

## 2021-05-29 DIAGNOSIS — D649 Anemia, unspecified: Secondary | ICD-10-CM | POA: Diagnosis not present

## 2021-05-29 DIAGNOSIS — F32A Depression, unspecified: Secondary | ICD-10-CM | POA: Diagnosis not present

## 2021-05-29 DIAGNOSIS — M810 Age-related osteoporosis without current pathological fracture: Secondary | ICD-10-CM | POA: Diagnosis not present

## 2021-05-29 DIAGNOSIS — S72002D Fracture of unspecified part of neck of left femur, subsequent encounter for closed fracture with routine healing: Secondary | ICD-10-CM | POA: Diagnosis not present

## 2021-05-29 DIAGNOSIS — Z96642 Presence of left artificial hip joint: Secondary | ICD-10-CM | POA: Diagnosis not present

## 2021-05-29 DIAGNOSIS — J449 Chronic obstructive pulmonary disease, unspecified: Secondary | ICD-10-CM | POA: Diagnosis not present

## 2021-05-29 DIAGNOSIS — M545 Low back pain, unspecified: Secondary | ICD-10-CM | POA: Diagnosis not present

## 2021-05-29 DIAGNOSIS — Z7951 Long term (current) use of inhaled steroids: Secondary | ICD-10-CM | POA: Diagnosis not present

## 2021-05-29 DIAGNOSIS — M7150 Other bursitis, not elsewhere classified, unspecified site: Secondary | ICD-10-CM | POA: Diagnosis not present

## 2021-05-29 DIAGNOSIS — Z7982 Long term (current) use of aspirin: Secondary | ICD-10-CM | POA: Diagnosis not present

## 2021-05-29 DIAGNOSIS — Z96653 Presence of artificial knee joint, bilateral: Secondary | ICD-10-CM | POA: Diagnosis not present

## 2021-05-29 DIAGNOSIS — M2010 Hallux valgus (acquired), unspecified foot: Secondary | ICD-10-CM | POA: Diagnosis not present

## 2021-05-30 DIAGNOSIS — Z7982 Long term (current) use of aspirin: Secondary | ICD-10-CM | POA: Diagnosis not present

## 2021-05-30 DIAGNOSIS — D649 Anemia, unspecified: Secondary | ICD-10-CM | POA: Diagnosis not present

## 2021-05-30 DIAGNOSIS — M2010 Hallux valgus (acquired), unspecified foot: Secondary | ICD-10-CM | POA: Diagnosis not present

## 2021-05-30 DIAGNOSIS — I10 Essential (primary) hypertension: Secondary | ICD-10-CM | POA: Diagnosis not present

## 2021-05-30 DIAGNOSIS — S72002D Fracture of unspecified part of neck of left femur, subsequent encounter for closed fracture with routine healing: Secondary | ICD-10-CM | POA: Diagnosis not present

## 2021-05-30 DIAGNOSIS — Z9181 History of falling: Secondary | ICD-10-CM | POA: Diagnosis not present

## 2021-05-30 DIAGNOSIS — Z87440 Personal history of urinary (tract) infections: Secondary | ICD-10-CM | POA: Diagnosis not present

## 2021-05-30 DIAGNOSIS — M810 Age-related osteoporosis without current pathological fracture: Secondary | ICD-10-CM | POA: Diagnosis not present

## 2021-05-30 DIAGNOSIS — Z7951 Long term (current) use of inhaled steroids: Secondary | ICD-10-CM | POA: Diagnosis not present

## 2021-05-30 DIAGNOSIS — F32A Depression, unspecified: Secondary | ICD-10-CM | POA: Diagnosis not present

## 2021-05-30 DIAGNOSIS — M7150 Other bursitis, not elsewhere classified, unspecified site: Secondary | ICD-10-CM | POA: Diagnosis not present

## 2021-05-30 DIAGNOSIS — J449 Chronic obstructive pulmonary disease, unspecified: Secondary | ICD-10-CM | POA: Diagnosis not present

## 2021-05-30 DIAGNOSIS — Z96642 Presence of left artificial hip joint: Secondary | ICD-10-CM | POA: Diagnosis not present

## 2021-05-30 DIAGNOSIS — M545 Low back pain, unspecified: Secondary | ICD-10-CM | POA: Diagnosis not present

## 2021-05-30 DIAGNOSIS — Z96653 Presence of artificial knee joint, bilateral: Secondary | ICD-10-CM | POA: Diagnosis not present

## 2021-05-30 DIAGNOSIS — Z96611 Presence of right artificial shoulder joint: Secondary | ICD-10-CM | POA: Diagnosis not present

## 2021-06-04 DIAGNOSIS — J449 Chronic obstructive pulmonary disease, unspecified: Secondary | ICD-10-CM | POA: Diagnosis not present

## 2021-06-04 DIAGNOSIS — Z96611 Presence of right artificial shoulder joint: Secondary | ICD-10-CM | POA: Diagnosis not present

## 2021-06-04 DIAGNOSIS — I10 Essential (primary) hypertension: Secondary | ICD-10-CM | POA: Diagnosis not present

## 2021-06-04 DIAGNOSIS — Z96653 Presence of artificial knee joint, bilateral: Secondary | ICD-10-CM | POA: Diagnosis not present

## 2021-06-04 DIAGNOSIS — Z87440 Personal history of urinary (tract) infections: Secondary | ICD-10-CM | POA: Diagnosis not present

## 2021-06-04 DIAGNOSIS — Z7982 Long term (current) use of aspirin: Secondary | ICD-10-CM | POA: Diagnosis not present

## 2021-06-04 DIAGNOSIS — M810 Age-related osteoporosis without current pathological fracture: Secondary | ICD-10-CM | POA: Diagnosis not present

## 2021-06-04 DIAGNOSIS — Z96642 Presence of left artificial hip joint: Secondary | ICD-10-CM | POA: Diagnosis not present

## 2021-06-04 DIAGNOSIS — Z9181 History of falling: Secondary | ICD-10-CM | POA: Diagnosis not present

## 2021-06-04 DIAGNOSIS — D649 Anemia, unspecified: Secondary | ICD-10-CM | POA: Diagnosis not present

## 2021-06-04 DIAGNOSIS — M545 Low back pain, unspecified: Secondary | ICD-10-CM | POA: Diagnosis not present

## 2021-06-04 DIAGNOSIS — S72002D Fracture of unspecified part of neck of left femur, subsequent encounter for closed fracture with routine healing: Secondary | ICD-10-CM | POA: Diagnosis not present

## 2021-06-04 DIAGNOSIS — M7150 Other bursitis, not elsewhere classified, unspecified site: Secondary | ICD-10-CM | POA: Diagnosis not present

## 2021-06-04 DIAGNOSIS — M2010 Hallux valgus (acquired), unspecified foot: Secondary | ICD-10-CM | POA: Diagnosis not present

## 2021-06-04 DIAGNOSIS — Z7951 Long term (current) use of inhaled steroids: Secondary | ICD-10-CM | POA: Diagnosis not present

## 2021-06-04 DIAGNOSIS — F32A Depression, unspecified: Secondary | ICD-10-CM | POA: Diagnosis not present

## 2021-06-05 DIAGNOSIS — M810 Age-related osteoporosis without current pathological fracture: Secondary | ICD-10-CM | POA: Diagnosis not present

## 2021-06-05 DIAGNOSIS — M545 Low back pain, unspecified: Secondary | ICD-10-CM | POA: Diagnosis not present

## 2021-06-05 DIAGNOSIS — D649 Anemia, unspecified: Secondary | ICD-10-CM | POA: Diagnosis not present

## 2021-06-05 DIAGNOSIS — Z96611 Presence of right artificial shoulder joint: Secondary | ICD-10-CM | POA: Diagnosis not present

## 2021-06-05 DIAGNOSIS — F32A Depression, unspecified: Secondary | ICD-10-CM | POA: Diagnosis not present

## 2021-06-05 DIAGNOSIS — I10 Essential (primary) hypertension: Secondary | ICD-10-CM | POA: Diagnosis not present

## 2021-06-05 DIAGNOSIS — Z87440 Personal history of urinary (tract) infections: Secondary | ICD-10-CM | POA: Diagnosis not present

## 2021-06-05 DIAGNOSIS — M7150 Other bursitis, not elsewhere classified, unspecified site: Secondary | ICD-10-CM | POA: Diagnosis not present

## 2021-06-05 DIAGNOSIS — S72002D Fracture of unspecified part of neck of left femur, subsequent encounter for closed fracture with routine healing: Secondary | ICD-10-CM | POA: Diagnosis not present

## 2021-06-05 DIAGNOSIS — J449 Chronic obstructive pulmonary disease, unspecified: Secondary | ICD-10-CM | POA: Diagnosis not present

## 2021-06-05 DIAGNOSIS — Z7951 Long term (current) use of inhaled steroids: Secondary | ICD-10-CM | POA: Diagnosis not present

## 2021-06-05 DIAGNOSIS — M2010 Hallux valgus (acquired), unspecified foot: Secondary | ICD-10-CM | POA: Diagnosis not present

## 2021-06-05 DIAGNOSIS — Z7982 Long term (current) use of aspirin: Secondary | ICD-10-CM | POA: Diagnosis not present

## 2021-06-05 DIAGNOSIS — Z96642 Presence of left artificial hip joint: Secondary | ICD-10-CM | POA: Diagnosis not present

## 2021-06-05 DIAGNOSIS — Z96653 Presence of artificial knee joint, bilateral: Secondary | ICD-10-CM | POA: Diagnosis not present

## 2021-06-05 DIAGNOSIS — Z9181 History of falling: Secondary | ICD-10-CM | POA: Diagnosis not present

## 2021-06-12 DIAGNOSIS — D649 Anemia, unspecified: Secondary | ICD-10-CM | POA: Diagnosis not present

## 2021-06-12 DIAGNOSIS — M545 Low back pain, unspecified: Secondary | ICD-10-CM | POA: Diagnosis not present

## 2021-06-12 DIAGNOSIS — Z7982 Long term (current) use of aspirin: Secondary | ICD-10-CM | POA: Diagnosis not present

## 2021-06-12 DIAGNOSIS — Z9181 History of falling: Secondary | ICD-10-CM | POA: Diagnosis not present

## 2021-06-12 DIAGNOSIS — I10 Essential (primary) hypertension: Secondary | ICD-10-CM | POA: Diagnosis not present

## 2021-06-12 DIAGNOSIS — Z87440 Personal history of urinary (tract) infections: Secondary | ICD-10-CM | POA: Diagnosis not present

## 2021-06-12 DIAGNOSIS — M810 Age-related osteoporosis without current pathological fracture: Secondary | ICD-10-CM | POA: Diagnosis not present

## 2021-06-12 DIAGNOSIS — Z96642 Presence of left artificial hip joint: Secondary | ICD-10-CM | POA: Diagnosis not present

## 2021-06-12 DIAGNOSIS — Z96653 Presence of artificial knee joint, bilateral: Secondary | ICD-10-CM | POA: Diagnosis not present

## 2021-06-12 DIAGNOSIS — M7150 Other bursitis, not elsewhere classified, unspecified site: Secondary | ICD-10-CM | POA: Diagnosis not present

## 2021-06-12 DIAGNOSIS — M2010 Hallux valgus (acquired), unspecified foot: Secondary | ICD-10-CM | POA: Diagnosis not present

## 2021-06-12 DIAGNOSIS — F32A Depression, unspecified: Secondary | ICD-10-CM | POA: Diagnosis not present

## 2021-06-12 DIAGNOSIS — Z7951 Long term (current) use of inhaled steroids: Secondary | ICD-10-CM | POA: Diagnosis not present

## 2021-06-12 DIAGNOSIS — J449 Chronic obstructive pulmonary disease, unspecified: Secondary | ICD-10-CM | POA: Diagnosis not present

## 2021-06-12 DIAGNOSIS — Z96611 Presence of right artificial shoulder joint: Secondary | ICD-10-CM | POA: Diagnosis not present

## 2021-06-12 DIAGNOSIS — S72002D Fracture of unspecified part of neck of left femur, subsequent encounter for closed fracture with routine healing: Secondary | ICD-10-CM | POA: Diagnosis not present

## 2021-06-13 DIAGNOSIS — J449 Chronic obstructive pulmonary disease, unspecified: Secondary | ICD-10-CM | POA: Diagnosis not present

## 2021-06-13 DIAGNOSIS — Z9181 History of falling: Secondary | ICD-10-CM | POA: Diagnosis not present

## 2021-06-13 DIAGNOSIS — Z7982 Long term (current) use of aspirin: Secondary | ICD-10-CM | POA: Diagnosis not present

## 2021-06-13 DIAGNOSIS — M545 Low back pain, unspecified: Secondary | ICD-10-CM | POA: Diagnosis not present

## 2021-06-13 DIAGNOSIS — F32A Depression, unspecified: Secondary | ICD-10-CM | POA: Diagnosis not present

## 2021-06-13 DIAGNOSIS — Z96642 Presence of left artificial hip joint: Secondary | ICD-10-CM | POA: Diagnosis not present

## 2021-06-13 DIAGNOSIS — M7150 Other bursitis, not elsewhere classified, unspecified site: Secondary | ICD-10-CM | POA: Diagnosis not present

## 2021-06-13 DIAGNOSIS — Z96611 Presence of right artificial shoulder joint: Secondary | ICD-10-CM | POA: Diagnosis not present

## 2021-06-13 DIAGNOSIS — M810 Age-related osteoporosis without current pathological fracture: Secondary | ICD-10-CM | POA: Diagnosis not present

## 2021-06-13 DIAGNOSIS — M2010 Hallux valgus (acquired), unspecified foot: Secondary | ICD-10-CM | POA: Diagnosis not present

## 2021-06-13 DIAGNOSIS — Z96653 Presence of artificial knee joint, bilateral: Secondary | ICD-10-CM | POA: Diagnosis not present

## 2021-06-13 DIAGNOSIS — Z7951 Long term (current) use of inhaled steroids: Secondary | ICD-10-CM | POA: Diagnosis not present

## 2021-06-13 DIAGNOSIS — I10 Essential (primary) hypertension: Secondary | ICD-10-CM | POA: Diagnosis not present

## 2021-06-13 DIAGNOSIS — S72002D Fracture of unspecified part of neck of left femur, subsequent encounter for closed fracture with routine healing: Secondary | ICD-10-CM | POA: Diagnosis not present

## 2021-06-13 DIAGNOSIS — D649 Anemia, unspecified: Secondary | ICD-10-CM | POA: Diagnosis not present

## 2021-06-13 DIAGNOSIS — Z87440 Personal history of urinary (tract) infections: Secondary | ICD-10-CM | POA: Diagnosis not present

## 2021-06-14 DIAGNOSIS — M810 Age-related osteoporosis without current pathological fracture: Secondary | ICD-10-CM | POA: Diagnosis not present

## 2021-06-14 DIAGNOSIS — M2010 Hallux valgus (acquired), unspecified foot: Secondary | ICD-10-CM | POA: Diagnosis not present

## 2021-06-14 DIAGNOSIS — Z7951 Long term (current) use of inhaled steroids: Secondary | ICD-10-CM | POA: Diagnosis not present

## 2021-06-14 DIAGNOSIS — D649 Anemia, unspecified: Secondary | ICD-10-CM | POA: Diagnosis not present

## 2021-06-14 DIAGNOSIS — S72002D Fracture of unspecified part of neck of left femur, subsequent encounter for closed fracture with routine healing: Secondary | ICD-10-CM | POA: Diagnosis not present

## 2021-06-14 DIAGNOSIS — M7150 Other bursitis, not elsewhere classified, unspecified site: Secondary | ICD-10-CM | POA: Diagnosis not present

## 2021-06-14 DIAGNOSIS — F32A Depression, unspecified: Secondary | ICD-10-CM | POA: Diagnosis not present

## 2021-06-14 DIAGNOSIS — Z96642 Presence of left artificial hip joint: Secondary | ICD-10-CM | POA: Diagnosis not present

## 2021-06-14 DIAGNOSIS — Z7982 Long term (current) use of aspirin: Secondary | ICD-10-CM | POA: Diagnosis not present

## 2021-06-14 DIAGNOSIS — M545 Low back pain, unspecified: Secondary | ICD-10-CM | POA: Diagnosis not present

## 2021-06-14 DIAGNOSIS — J449 Chronic obstructive pulmonary disease, unspecified: Secondary | ICD-10-CM | POA: Diagnosis not present

## 2021-06-14 DIAGNOSIS — Z9181 History of falling: Secondary | ICD-10-CM | POA: Diagnosis not present

## 2021-06-14 DIAGNOSIS — I10 Essential (primary) hypertension: Secondary | ICD-10-CM | POA: Diagnosis not present

## 2021-06-14 DIAGNOSIS — Z96611 Presence of right artificial shoulder joint: Secondary | ICD-10-CM | POA: Diagnosis not present

## 2021-06-14 DIAGNOSIS — Z87440 Personal history of urinary (tract) infections: Secondary | ICD-10-CM | POA: Diagnosis not present

## 2021-06-14 DIAGNOSIS — Z96653 Presence of artificial knee joint, bilateral: Secondary | ICD-10-CM | POA: Diagnosis not present

## 2021-06-18 DIAGNOSIS — M810 Age-related osteoporosis without current pathological fracture: Secondary | ICD-10-CM | POA: Diagnosis not present

## 2021-06-18 DIAGNOSIS — M545 Low back pain, unspecified: Secondary | ICD-10-CM | POA: Diagnosis not present

## 2021-06-18 DIAGNOSIS — J449 Chronic obstructive pulmonary disease, unspecified: Secondary | ICD-10-CM | POA: Diagnosis not present

## 2021-06-18 DIAGNOSIS — Z7951 Long term (current) use of inhaled steroids: Secondary | ICD-10-CM | POA: Diagnosis not present

## 2021-06-18 DIAGNOSIS — I10 Essential (primary) hypertension: Secondary | ICD-10-CM | POA: Diagnosis not present

## 2021-06-18 DIAGNOSIS — M2010 Hallux valgus (acquired), unspecified foot: Secondary | ICD-10-CM | POA: Diagnosis not present

## 2021-06-18 DIAGNOSIS — Z96611 Presence of right artificial shoulder joint: Secondary | ICD-10-CM | POA: Diagnosis not present

## 2021-06-18 DIAGNOSIS — M7150 Other bursitis, not elsewhere classified, unspecified site: Secondary | ICD-10-CM | POA: Diagnosis not present

## 2021-06-18 DIAGNOSIS — S72002D Fracture of unspecified part of neck of left femur, subsequent encounter for closed fracture with routine healing: Secondary | ICD-10-CM | POA: Diagnosis not present

## 2021-06-18 DIAGNOSIS — Z9181 History of falling: Secondary | ICD-10-CM | POA: Diagnosis not present

## 2021-06-18 DIAGNOSIS — S72002A Fracture of unspecified part of neck of left femur, initial encounter for closed fracture: Secondary | ICD-10-CM | POA: Diagnosis not present

## 2021-06-18 DIAGNOSIS — F32A Depression, unspecified: Secondary | ICD-10-CM | POA: Diagnosis not present

## 2021-06-18 DIAGNOSIS — Z96642 Presence of left artificial hip joint: Secondary | ICD-10-CM | POA: Diagnosis not present

## 2021-06-18 DIAGNOSIS — Z87440 Personal history of urinary (tract) infections: Secondary | ICD-10-CM | POA: Diagnosis not present

## 2021-06-18 DIAGNOSIS — Z96653 Presence of artificial knee joint, bilateral: Secondary | ICD-10-CM | POA: Diagnosis not present

## 2021-06-18 DIAGNOSIS — Z7982 Long term (current) use of aspirin: Secondary | ICD-10-CM | POA: Diagnosis not present

## 2021-06-18 DIAGNOSIS — D649 Anemia, unspecified: Secondary | ICD-10-CM | POA: Diagnosis not present

## 2021-06-19 DIAGNOSIS — Z87440 Personal history of urinary (tract) infections: Secondary | ICD-10-CM | POA: Diagnosis not present

## 2021-06-19 DIAGNOSIS — Z96611 Presence of right artificial shoulder joint: Secondary | ICD-10-CM | POA: Diagnosis not present

## 2021-06-19 DIAGNOSIS — M810 Age-related osteoporosis without current pathological fracture: Secondary | ICD-10-CM | POA: Diagnosis not present

## 2021-06-19 DIAGNOSIS — Z7951 Long term (current) use of inhaled steroids: Secondary | ICD-10-CM | POA: Diagnosis not present

## 2021-06-19 DIAGNOSIS — Z96642 Presence of left artificial hip joint: Secondary | ICD-10-CM | POA: Diagnosis not present

## 2021-06-19 DIAGNOSIS — Z96653 Presence of artificial knee joint, bilateral: Secondary | ICD-10-CM | POA: Diagnosis not present

## 2021-06-19 DIAGNOSIS — M2010 Hallux valgus (acquired), unspecified foot: Secondary | ICD-10-CM | POA: Diagnosis not present

## 2021-06-19 DIAGNOSIS — J301 Allergic rhinitis due to pollen: Secondary | ICD-10-CM | POA: Diagnosis not present

## 2021-06-19 DIAGNOSIS — I10 Essential (primary) hypertension: Secondary | ICD-10-CM | POA: Diagnosis not present

## 2021-06-19 DIAGNOSIS — Z7982 Long term (current) use of aspirin: Secondary | ICD-10-CM | POA: Diagnosis not present

## 2021-06-19 DIAGNOSIS — M7150 Other bursitis, not elsewhere classified, unspecified site: Secondary | ICD-10-CM | POA: Diagnosis not present

## 2021-06-19 DIAGNOSIS — J449 Chronic obstructive pulmonary disease, unspecified: Secondary | ICD-10-CM | POA: Diagnosis not present

## 2021-06-19 DIAGNOSIS — S72002D Fracture of unspecified part of neck of left femur, subsequent encounter for closed fracture with routine healing: Secondary | ICD-10-CM | POA: Diagnosis not present

## 2021-06-19 DIAGNOSIS — D649 Anemia, unspecified: Secondary | ICD-10-CM | POA: Diagnosis not present

## 2021-06-19 DIAGNOSIS — F32A Depression, unspecified: Secondary | ICD-10-CM | POA: Diagnosis not present

## 2021-06-19 DIAGNOSIS — M545 Low back pain, unspecified: Secondary | ICD-10-CM | POA: Diagnosis not present

## 2021-06-19 DIAGNOSIS — Z9181 History of falling: Secondary | ICD-10-CM | POA: Diagnosis not present

## 2021-06-21 DIAGNOSIS — S72002D Fracture of unspecified part of neck of left femur, subsequent encounter for closed fracture with routine healing: Secondary | ICD-10-CM | POA: Diagnosis not present

## 2021-06-21 DIAGNOSIS — Z471 Aftercare following joint replacement surgery: Secondary | ICD-10-CM | POA: Diagnosis not present

## 2021-06-21 DIAGNOSIS — Z96642 Presence of left artificial hip joint: Secondary | ICD-10-CM | POA: Diagnosis not present

## 2021-06-26 DIAGNOSIS — R531 Weakness: Secondary | ICD-10-CM | POA: Diagnosis not present

## 2021-06-26 DIAGNOSIS — Z9889 Other specified postprocedural states: Secondary | ICD-10-CM | POA: Diagnosis not present

## 2021-06-26 DIAGNOSIS — S72302K Unspecified fracture of shaft of left femur, subsequent encounter for closed fracture with nonunion: Secondary | ICD-10-CM | POA: Diagnosis not present

## 2021-06-26 DIAGNOSIS — S72302D Unspecified fracture of shaft of left femur, subsequent encounter for closed fracture with routine healing: Secondary | ICD-10-CM | POA: Diagnosis not present

## 2021-06-26 DIAGNOSIS — M25511 Pain in right shoulder: Secondary | ICD-10-CM | POA: Diagnosis not present

## 2021-06-26 DIAGNOSIS — Z882 Allergy status to sulfonamides status: Secondary | ICD-10-CM | POA: Diagnosis not present

## 2021-06-26 DIAGNOSIS — J449 Chronic obstructive pulmonary disease, unspecified: Secondary | ICD-10-CM | POA: Diagnosis not present

## 2021-06-26 DIAGNOSIS — M9702XA Periprosthetic fracture around internal prosthetic left hip joint, initial encounter: Secondary | ICD-10-CM | POA: Diagnosis not present

## 2021-06-26 DIAGNOSIS — Z880 Allergy status to penicillin: Secondary | ICD-10-CM | POA: Diagnosis not present

## 2021-06-26 DIAGNOSIS — Z96642 Presence of left artificial hip joint: Secondary | ICD-10-CM | POA: Diagnosis not present

## 2021-06-26 DIAGNOSIS — Z8249 Family history of ischemic heart disease and other diseases of the circulatory system: Secondary | ICD-10-CM | POA: Diagnosis not present

## 2021-06-26 DIAGNOSIS — R2681 Unsteadiness on feet: Secondary | ICD-10-CM | POA: Diagnosis not present

## 2021-06-26 DIAGNOSIS — Z96652 Presence of left artificial knee joint: Secondary | ICD-10-CM | POA: Diagnosis not present

## 2021-06-26 DIAGNOSIS — M6281 Muscle weakness (generalized): Secondary | ICD-10-CM | POA: Diagnosis not present

## 2021-06-26 DIAGNOSIS — Z4789 Encounter for other orthopedic aftercare: Secondary | ICD-10-CM | POA: Diagnosis not present

## 2021-06-26 DIAGNOSIS — R339 Retention of urine, unspecified: Secondary | ICD-10-CM | POA: Diagnosis not present

## 2021-06-26 DIAGNOSIS — R5381 Other malaise: Secondary | ICD-10-CM | POA: Diagnosis not present

## 2021-06-26 DIAGNOSIS — S72002D Fracture of unspecified part of neck of left femur, subsequent encounter for closed fracture with routine healing: Secondary | ICD-10-CM | POA: Diagnosis not present

## 2021-06-26 DIAGNOSIS — J45909 Unspecified asthma, uncomplicated: Secondary | ICD-10-CM | POA: Diagnosis not present

## 2021-06-26 DIAGNOSIS — I10 Essential (primary) hypertension: Secondary | ICD-10-CM | POA: Diagnosis not present

## 2021-06-26 DIAGNOSIS — Z472 Encounter for removal of internal fixation device: Secondary | ICD-10-CM | POA: Diagnosis not present

## 2021-06-26 DIAGNOSIS — S72302P Unspecified fracture of shaft of left femur, subsequent encounter for closed fracture with malunion: Secondary | ICD-10-CM | POA: Diagnosis not present

## 2021-06-26 DIAGNOSIS — Z8781 Personal history of (healed) traumatic fracture: Secondary | ICD-10-CM | POA: Diagnosis not present

## 2021-06-26 DIAGNOSIS — I447 Left bundle-branch block, unspecified: Secondary | ICD-10-CM | POA: Diagnosis not present

## 2021-06-26 DIAGNOSIS — Z743 Need for continuous supervision: Secondary | ICD-10-CM | POA: Diagnosis not present

## 2021-06-26 DIAGNOSIS — Z885 Allergy status to narcotic agent status: Secondary | ICD-10-CM | POA: Diagnosis not present

## 2021-06-26 DIAGNOSIS — Z96611 Presence of right artificial shoulder joint: Secondary | ICD-10-CM | POA: Diagnosis not present

## 2021-06-26 DIAGNOSIS — M978XXA Periprosthetic fracture around other internal prosthetic joint, initial encounter: Secondary | ICD-10-CM | POA: Diagnosis not present

## 2021-07-03 DIAGNOSIS — R339 Retention of urine, unspecified: Secondary | ICD-10-CM | POA: Diagnosis not present

## 2021-07-03 DIAGNOSIS — M6281 Muscle weakness (generalized): Secondary | ICD-10-CM | POA: Diagnosis not present

## 2021-07-03 DIAGNOSIS — R531 Weakness: Secondary | ICD-10-CM | POA: Diagnosis not present

## 2021-07-03 DIAGNOSIS — J45909 Unspecified asthma, uncomplicated: Secondary | ICD-10-CM | POA: Diagnosis not present

## 2021-07-03 DIAGNOSIS — I10 Essential (primary) hypertension: Secondary | ICD-10-CM | POA: Diagnosis not present

## 2021-07-03 DIAGNOSIS — J449 Chronic obstructive pulmonary disease, unspecified: Secondary | ICD-10-CM | POA: Diagnosis not present

## 2021-07-03 DIAGNOSIS — S72302D Unspecified fracture of shaft of left femur, subsequent encounter for closed fracture with routine healing: Secondary | ICD-10-CM | POA: Diagnosis not present

## 2021-07-03 DIAGNOSIS — S72002D Fracture of unspecified part of neck of left femur, subsequent encounter for closed fracture with routine healing: Secondary | ICD-10-CM | POA: Diagnosis not present

## 2021-07-03 DIAGNOSIS — Z4802 Encounter for removal of sutures: Secondary | ICD-10-CM | POA: Diagnosis not present

## 2021-07-03 DIAGNOSIS — Z9889 Other specified postprocedural states: Secondary | ICD-10-CM | POA: Diagnosis not present

## 2021-07-03 DIAGNOSIS — N1330 Unspecified hydronephrosis: Secondary | ICD-10-CM | POA: Diagnosis not present

## 2021-07-03 DIAGNOSIS — R2681 Unsteadiness on feet: Secondary | ICD-10-CM | POA: Diagnosis not present

## 2021-07-03 DIAGNOSIS — Z4789 Encounter for other orthopedic aftercare: Secondary | ICD-10-CM | POA: Diagnosis not present

## 2021-07-03 DIAGNOSIS — S72009A Fracture of unspecified part of neck of unspecified femur, initial encounter for closed fracture: Secondary | ICD-10-CM | POA: Diagnosis not present

## 2021-07-03 DIAGNOSIS — Z743 Need for continuous supervision: Secondary | ICD-10-CM | POA: Diagnosis not present

## 2021-07-03 DIAGNOSIS — R5381 Other malaise: Secondary | ICD-10-CM | POA: Diagnosis not present

## 2021-07-03 DIAGNOSIS — Z8781 Personal history of (healed) traumatic fracture: Secondary | ICD-10-CM | POA: Diagnosis not present

## 2021-07-04 DIAGNOSIS — Z8781 Personal history of (healed) traumatic fracture: Secondary | ICD-10-CM | POA: Diagnosis not present

## 2021-07-04 DIAGNOSIS — J45909 Unspecified asthma, uncomplicated: Secondary | ICD-10-CM | POA: Diagnosis not present

## 2021-07-04 DIAGNOSIS — J449 Chronic obstructive pulmonary disease, unspecified: Secondary | ICD-10-CM | POA: Diagnosis not present

## 2021-07-04 DIAGNOSIS — I10 Essential (primary) hypertension: Secondary | ICD-10-CM | POA: Diagnosis not present

## 2021-07-04 DIAGNOSIS — Z9889 Other specified postprocedural states: Secondary | ICD-10-CM | POA: Diagnosis not present

## 2021-07-04 DIAGNOSIS — R531 Weakness: Secondary | ICD-10-CM | POA: Diagnosis not present

## 2021-07-04 DIAGNOSIS — R5381 Other malaise: Secondary | ICD-10-CM | POA: Diagnosis not present

## 2021-07-04 DIAGNOSIS — S72009A Fracture of unspecified part of neck of unspecified femur, initial encounter for closed fracture: Secondary | ICD-10-CM | POA: Diagnosis not present

## 2021-07-10 DIAGNOSIS — S72009A Fracture of unspecified part of neck of unspecified femur, initial encounter for closed fracture: Secondary | ICD-10-CM | POA: Diagnosis not present

## 2021-07-10 DIAGNOSIS — R5381 Other malaise: Secondary | ICD-10-CM | POA: Diagnosis not present

## 2021-07-10 DIAGNOSIS — R339 Retention of urine, unspecified: Secondary | ICD-10-CM | POA: Diagnosis not present

## 2021-07-10 DIAGNOSIS — I10 Essential (primary) hypertension: Secondary | ICD-10-CM | POA: Diagnosis not present

## 2021-07-10 DIAGNOSIS — Z8781 Personal history of (healed) traumatic fracture: Secondary | ICD-10-CM | POA: Diagnosis not present

## 2021-07-10 DIAGNOSIS — R531 Weakness: Secondary | ICD-10-CM | POA: Diagnosis not present

## 2021-07-10 DIAGNOSIS — J449 Chronic obstructive pulmonary disease, unspecified: Secondary | ICD-10-CM | POA: Diagnosis not present

## 2021-07-11 DIAGNOSIS — R339 Retention of urine, unspecified: Secondary | ICD-10-CM | POA: Diagnosis not present

## 2021-07-11 DIAGNOSIS — N1330 Unspecified hydronephrosis: Secondary | ICD-10-CM | POA: Diagnosis not present

## 2021-07-16 DIAGNOSIS — S72009A Fracture of unspecified part of neck of unspecified femur, initial encounter for closed fracture: Secondary | ICD-10-CM | POA: Diagnosis not present

## 2021-07-16 DIAGNOSIS — I10 Essential (primary) hypertension: Secondary | ICD-10-CM | POA: Diagnosis not present

## 2021-07-16 DIAGNOSIS — J449 Chronic obstructive pulmonary disease, unspecified: Secondary | ICD-10-CM | POA: Diagnosis not present

## 2021-07-16 DIAGNOSIS — J45909 Unspecified asthma, uncomplicated: Secondary | ICD-10-CM | POA: Diagnosis not present

## 2021-07-16 DIAGNOSIS — R5381 Other malaise: Secondary | ICD-10-CM | POA: Diagnosis not present

## 2021-07-16 DIAGNOSIS — R339 Retention of urine, unspecified: Secondary | ICD-10-CM | POA: Diagnosis not present

## 2021-07-17 DIAGNOSIS — S72002D Fracture of unspecified part of neck of left femur, subsequent encounter for closed fracture with routine healing: Secondary | ICD-10-CM | POA: Diagnosis not present

## 2021-07-17 DIAGNOSIS — Z4802 Encounter for removal of sutures: Secondary | ICD-10-CM | POA: Diagnosis not present

## 2021-07-23 DIAGNOSIS — J4540 Moderate persistent asthma, uncomplicated: Secondary | ICD-10-CM | POA: Diagnosis not present

## 2021-07-23 DIAGNOSIS — Z789 Other specified health status: Secondary | ICD-10-CM | POA: Diagnosis not present

## 2021-07-23 DIAGNOSIS — Z8781 Personal history of (healed) traumatic fracture: Secondary | ICD-10-CM | POA: Diagnosis not present

## 2021-07-23 DIAGNOSIS — S72002A Fracture of unspecified part of neck of left femur, initial encounter for closed fracture: Secondary | ICD-10-CM | POA: Diagnosis not present

## 2021-07-23 DIAGNOSIS — I10 Essential (primary) hypertension: Secondary | ICD-10-CM | POA: Diagnosis not present

## 2021-08-06 DIAGNOSIS — S72002D Fracture of unspecified part of neck of left femur, subsequent encounter for closed fracture with routine healing: Secondary | ICD-10-CM | POA: Diagnosis not present

## 2021-08-06 DIAGNOSIS — Z880 Allergy status to penicillin: Secondary | ICD-10-CM | POA: Diagnosis not present

## 2021-08-06 DIAGNOSIS — Z885 Allergy status to narcotic agent status: Secondary | ICD-10-CM | POA: Diagnosis not present

## 2021-08-06 DIAGNOSIS — Z882 Allergy status to sulfonamides status: Secondary | ICD-10-CM | POA: Diagnosis not present

## 2021-08-07 DIAGNOSIS — Z7951 Long term (current) use of inhaled steroids: Secondary | ICD-10-CM | POA: Diagnosis not present

## 2021-08-07 DIAGNOSIS — Z87891 Personal history of nicotine dependence: Secondary | ICD-10-CM | POA: Diagnosis not present

## 2021-08-07 DIAGNOSIS — J449 Chronic obstructive pulmonary disease, unspecified: Secondary | ICD-10-CM | POA: Diagnosis not present

## 2021-08-07 DIAGNOSIS — Z96642 Presence of left artificial hip joint: Secondary | ICD-10-CM | POA: Diagnosis not present

## 2021-08-07 DIAGNOSIS — S72002K Fracture of unspecified part of neck of left femur, subsequent encounter for closed fracture with nonunion: Secondary | ICD-10-CM | POA: Diagnosis not present

## 2021-08-07 DIAGNOSIS — I10 Essential (primary) hypertension: Secondary | ICD-10-CM | POA: Diagnosis not present

## 2021-08-12 DIAGNOSIS — Z7951 Long term (current) use of inhaled steroids: Secondary | ICD-10-CM | POA: Diagnosis not present

## 2021-08-12 DIAGNOSIS — J449 Chronic obstructive pulmonary disease, unspecified: Secondary | ICD-10-CM | POA: Diagnosis not present

## 2021-08-12 DIAGNOSIS — Z87891 Personal history of nicotine dependence: Secondary | ICD-10-CM | POA: Diagnosis not present

## 2021-08-12 DIAGNOSIS — Z96642 Presence of left artificial hip joint: Secondary | ICD-10-CM | POA: Diagnosis not present

## 2021-08-12 DIAGNOSIS — I10 Essential (primary) hypertension: Secondary | ICD-10-CM | POA: Diagnosis not present

## 2021-08-12 DIAGNOSIS — S72002K Fracture of unspecified part of neck of left femur, subsequent encounter for closed fracture with nonunion: Secondary | ICD-10-CM | POA: Diagnosis not present

## 2021-08-14 DIAGNOSIS — Z96642 Presence of left artificial hip joint: Secondary | ICD-10-CM | POA: Diagnosis not present

## 2021-08-14 DIAGNOSIS — Z7951 Long term (current) use of inhaled steroids: Secondary | ICD-10-CM | POA: Diagnosis not present

## 2021-08-14 DIAGNOSIS — J449 Chronic obstructive pulmonary disease, unspecified: Secondary | ICD-10-CM | POA: Diagnosis not present

## 2021-08-14 DIAGNOSIS — I10 Essential (primary) hypertension: Secondary | ICD-10-CM | POA: Diagnosis not present

## 2021-08-14 DIAGNOSIS — Z87891 Personal history of nicotine dependence: Secondary | ICD-10-CM | POA: Diagnosis not present

## 2021-08-14 DIAGNOSIS — S72002K Fracture of unspecified part of neck of left femur, subsequent encounter for closed fracture with nonunion: Secondary | ICD-10-CM | POA: Diagnosis not present

## 2021-08-19 DIAGNOSIS — J449 Chronic obstructive pulmonary disease, unspecified: Secondary | ICD-10-CM | POA: Diagnosis not present

## 2021-08-19 DIAGNOSIS — I10 Essential (primary) hypertension: Secondary | ICD-10-CM | POA: Diagnosis not present

## 2021-08-19 DIAGNOSIS — S72002K Fracture of unspecified part of neck of left femur, subsequent encounter for closed fracture with nonunion: Secondary | ICD-10-CM | POA: Diagnosis not present

## 2021-08-19 DIAGNOSIS — Z87891 Personal history of nicotine dependence: Secondary | ICD-10-CM | POA: Diagnosis not present

## 2021-08-19 DIAGNOSIS — Z7951 Long term (current) use of inhaled steroids: Secondary | ICD-10-CM | POA: Diagnosis not present

## 2021-08-19 DIAGNOSIS — Z96642 Presence of left artificial hip joint: Secondary | ICD-10-CM | POA: Diagnosis not present

## 2021-08-22 DIAGNOSIS — J449 Chronic obstructive pulmonary disease, unspecified: Secondary | ICD-10-CM | POA: Diagnosis not present

## 2021-08-22 DIAGNOSIS — I10 Essential (primary) hypertension: Secondary | ICD-10-CM | POA: Diagnosis not present

## 2021-08-22 DIAGNOSIS — Z7951 Long term (current) use of inhaled steroids: Secondary | ICD-10-CM | POA: Diagnosis not present

## 2021-08-22 DIAGNOSIS — Z87891 Personal history of nicotine dependence: Secondary | ICD-10-CM | POA: Diagnosis not present

## 2021-08-22 DIAGNOSIS — Z96642 Presence of left artificial hip joint: Secondary | ICD-10-CM | POA: Diagnosis not present

## 2021-08-22 DIAGNOSIS — S72002K Fracture of unspecified part of neck of left femur, subsequent encounter for closed fracture with nonunion: Secondary | ICD-10-CM | POA: Diagnosis not present

## 2021-08-26 DIAGNOSIS — S72002K Fracture of unspecified part of neck of left femur, subsequent encounter for closed fracture with nonunion: Secondary | ICD-10-CM | POA: Diagnosis not present

## 2021-08-26 DIAGNOSIS — J449 Chronic obstructive pulmonary disease, unspecified: Secondary | ICD-10-CM | POA: Diagnosis not present

## 2021-08-26 DIAGNOSIS — Z7951 Long term (current) use of inhaled steroids: Secondary | ICD-10-CM | POA: Diagnosis not present

## 2021-08-26 DIAGNOSIS — Z87891 Personal history of nicotine dependence: Secondary | ICD-10-CM | POA: Diagnosis not present

## 2021-08-26 DIAGNOSIS — I10 Essential (primary) hypertension: Secondary | ICD-10-CM | POA: Diagnosis not present

## 2021-08-26 DIAGNOSIS — Z96642 Presence of left artificial hip joint: Secondary | ICD-10-CM | POA: Diagnosis not present

## 2021-09-02 DIAGNOSIS — S72002K Fracture of unspecified part of neck of left femur, subsequent encounter for closed fracture with nonunion: Secondary | ICD-10-CM | POA: Diagnosis not present

## 2021-09-02 DIAGNOSIS — Z7951 Long term (current) use of inhaled steroids: Secondary | ICD-10-CM | POA: Diagnosis not present

## 2021-09-02 DIAGNOSIS — Z87891 Personal history of nicotine dependence: Secondary | ICD-10-CM | POA: Diagnosis not present

## 2021-09-02 DIAGNOSIS — J449 Chronic obstructive pulmonary disease, unspecified: Secondary | ICD-10-CM | POA: Diagnosis not present

## 2021-09-02 DIAGNOSIS — I10 Essential (primary) hypertension: Secondary | ICD-10-CM | POA: Diagnosis not present

## 2021-09-02 DIAGNOSIS — Z96642 Presence of left artificial hip joint: Secondary | ICD-10-CM | POA: Diagnosis not present

## 2021-09-03 DIAGNOSIS — G4733 Obstructive sleep apnea (adult) (pediatric): Secondary | ICD-10-CM | POA: Diagnosis not present

## 2021-09-03 DIAGNOSIS — J4540 Moderate persistent asthma, uncomplicated: Secondary | ICD-10-CM | POA: Diagnosis not present

## 2021-09-03 DIAGNOSIS — I10 Essential (primary) hypertension: Secondary | ICD-10-CM | POA: Diagnosis not present

## 2021-09-03 DIAGNOSIS — E785 Hyperlipidemia, unspecified: Secondary | ICD-10-CM | POA: Diagnosis not present

## 2021-09-04 DIAGNOSIS — J449 Chronic obstructive pulmonary disease, unspecified: Secondary | ICD-10-CM | POA: Diagnosis not present

## 2021-09-04 DIAGNOSIS — Z7951 Long term (current) use of inhaled steroids: Secondary | ICD-10-CM | POA: Diagnosis not present

## 2021-09-04 DIAGNOSIS — I10 Essential (primary) hypertension: Secondary | ICD-10-CM | POA: Diagnosis not present

## 2021-09-04 DIAGNOSIS — Z96642 Presence of left artificial hip joint: Secondary | ICD-10-CM | POA: Diagnosis not present

## 2021-09-04 DIAGNOSIS — S72002K Fracture of unspecified part of neck of left femur, subsequent encounter for closed fracture with nonunion: Secondary | ICD-10-CM | POA: Diagnosis not present

## 2021-09-04 DIAGNOSIS — Z87891 Personal history of nicotine dependence: Secondary | ICD-10-CM | POA: Diagnosis not present

## 2021-09-09 DIAGNOSIS — Z7951 Long term (current) use of inhaled steroids: Secondary | ICD-10-CM | POA: Diagnosis not present

## 2021-09-09 DIAGNOSIS — S72002K Fracture of unspecified part of neck of left femur, subsequent encounter for closed fracture with nonunion: Secondary | ICD-10-CM | POA: Diagnosis not present

## 2021-09-09 DIAGNOSIS — Z96642 Presence of left artificial hip joint: Secondary | ICD-10-CM | POA: Diagnosis not present

## 2021-09-09 DIAGNOSIS — J449 Chronic obstructive pulmonary disease, unspecified: Secondary | ICD-10-CM | POA: Diagnosis not present

## 2021-09-09 DIAGNOSIS — I10 Essential (primary) hypertension: Secondary | ICD-10-CM | POA: Diagnosis not present

## 2021-09-09 DIAGNOSIS — Z87891 Personal history of nicotine dependence: Secondary | ICD-10-CM | POA: Diagnosis not present

## 2021-09-12 DIAGNOSIS — J449 Chronic obstructive pulmonary disease, unspecified: Secondary | ICD-10-CM | POA: Diagnosis not present

## 2021-09-12 DIAGNOSIS — Z96642 Presence of left artificial hip joint: Secondary | ICD-10-CM | POA: Diagnosis not present

## 2021-09-12 DIAGNOSIS — Z87891 Personal history of nicotine dependence: Secondary | ICD-10-CM | POA: Diagnosis not present

## 2021-09-12 DIAGNOSIS — I10 Essential (primary) hypertension: Secondary | ICD-10-CM | POA: Diagnosis not present

## 2021-09-12 DIAGNOSIS — Z7951 Long term (current) use of inhaled steroids: Secondary | ICD-10-CM | POA: Diagnosis not present

## 2021-09-12 DIAGNOSIS — S72002K Fracture of unspecified part of neck of left femur, subsequent encounter for closed fracture with nonunion: Secondary | ICD-10-CM | POA: Diagnosis not present

## 2021-09-16 DIAGNOSIS — Z7951 Long term (current) use of inhaled steroids: Secondary | ICD-10-CM | POA: Diagnosis not present

## 2021-09-16 DIAGNOSIS — Z87891 Personal history of nicotine dependence: Secondary | ICD-10-CM | POA: Diagnosis not present

## 2021-09-16 DIAGNOSIS — J449 Chronic obstructive pulmonary disease, unspecified: Secondary | ICD-10-CM | POA: Diagnosis not present

## 2021-09-16 DIAGNOSIS — Z96642 Presence of left artificial hip joint: Secondary | ICD-10-CM | POA: Diagnosis not present

## 2021-09-16 DIAGNOSIS — I10 Essential (primary) hypertension: Secondary | ICD-10-CM | POA: Diagnosis not present

## 2021-09-16 DIAGNOSIS — S72002K Fracture of unspecified part of neck of left femur, subsequent encounter for closed fracture with nonunion: Secondary | ICD-10-CM | POA: Diagnosis not present

## 2021-09-19 DIAGNOSIS — I10 Essential (primary) hypertension: Secondary | ICD-10-CM | POA: Diagnosis not present

## 2021-09-19 DIAGNOSIS — Z7951 Long term (current) use of inhaled steroids: Secondary | ICD-10-CM | POA: Diagnosis not present

## 2021-09-19 DIAGNOSIS — S72302K Unspecified fracture of shaft of left femur, subsequent encounter for closed fracture with nonunion: Secondary | ICD-10-CM | POA: Diagnosis not present

## 2021-09-19 DIAGNOSIS — J449 Chronic obstructive pulmonary disease, unspecified: Secondary | ICD-10-CM | POA: Diagnosis not present

## 2021-09-19 DIAGNOSIS — S72002K Fracture of unspecified part of neck of left femur, subsequent encounter for closed fracture with nonunion: Secondary | ICD-10-CM | POA: Diagnosis not present

## 2021-09-19 DIAGNOSIS — Z9889 Other specified postprocedural states: Secondary | ICD-10-CM | POA: Diagnosis not present

## 2021-09-19 DIAGNOSIS — Z96652 Presence of left artificial knee joint: Secondary | ICD-10-CM | POA: Diagnosis not present

## 2021-09-19 DIAGNOSIS — Z96642 Presence of left artificial hip joint: Secondary | ICD-10-CM | POA: Diagnosis not present

## 2021-09-19 DIAGNOSIS — S72392D Other fracture of shaft of left femur, subsequent encounter for closed fracture with routine healing: Secondary | ICD-10-CM | POA: Diagnosis not present

## 2021-09-19 DIAGNOSIS — Z87891 Personal history of nicotine dependence: Secondary | ICD-10-CM | POA: Diagnosis not present

## 2021-09-23 DIAGNOSIS — J208 Acute bronchitis due to other specified organisms: Secondary | ICD-10-CM | POA: Diagnosis not present

## 2021-09-23 DIAGNOSIS — J019 Acute sinusitis, unspecified: Secondary | ICD-10-CM | POA: Diagnosis not present

## 2021-09-23 DIAGNOSIS — I10 Essential (primary) hypertension: Secondary | ICD-10-CM | POA: Diagnosis not present

## 2021-09-24 DIAGNOSIS — J449 Chronic obstructive pulmonary disease, unspecified: Secondary | ICD-10-CM | POA: Diagnosis not present

## 2021-09-24 DIAGNOSIS — I10 Essential (primary) hypertension: Secondary | ICD-10-CM | POA: Diagnosis not present

## 2021-09-24 DIAGNOSIS — S72002K Fracture of unspecified part of neck of left femur, subsequent encounter for closed fracture with nonunion: Secondary | ICD-10-CM | POA: Diagnosis not present

## 2021-09-24 DIAGNOSIS — Z96642 Presence of left artificial hip joint: Secondary | ICD-10-CM | POA: Diagnosis not present

## 2021-09-24 DIAGNOSIS — Z87891 Personal history of nicotine dependence: Secondary | ICD-10-CM | POA: Diagnosis not present

## 2021-09-24 DIAGNOSIS — Z7951 Long term (current) use of inhaled steroids: Secondary | ICD-10-CM | POA: Diagnosis not present

## 2021-09-26 DIAGNOSIS — Z96642 Presence of left artificial hip joint: Secondary | ICD-10-CM | POA: Diagnosis not present

## 2021-09-26 DIAGNOSIS — Z7951 Long term (current) use of inhaled steroids: Secondary | ICD-10-CM | POA: Diagnosis not present

## 2021-09-26 DIAGNOSIS — J449 Chronic obstructive pulmonary disease, unspecified: Secondary | ICD-10-CM | POA: Diagnosis not present

## 2021-09-26 DIAGNOSIS — Z87891 Personal history of nicotine dependence: Secondary | ICD-10-CM | POA: Diagnosis not present

## 2021-09-26 DIAGNOSIS — S72002K Fracture of unspecified part of neck of left femur, subsequent encounter for closed fracture with nonunion: Secondary | ICD-10-CM | POA: Diagnosis not present

## 2021-09-26 DIAGNOSIS — I10 Essential (primary) hypertension: Secondary | ICD-10-CM | POA: Diagnosis not present

## 2021-10-02 DIAGNOSIS — S72302D Unspecified fracture of shaft of left femur, subsequent encounter for closed fracture with routine healing: Secondary | ICD-10-CM | POA: Diagnosis not present

## 2021-10-02 DIAGNOSIS — Z96652 Presence of left artificial knee joint: Secondary | ICD-10-CM | POA: Diagnosis not present

## 2021-10-02 DIAGNOSIS — Z8781 Personal history of (healed) traumatic fracture: Secondary | ICD-10-CM | POA: Diagnosis not present

## 2021-10-02 DIAGNOSIS — S72352D Displaced comminuted fracture of shaft of left femur, subsequent encounter for closed fracture with routine healing: Secondary | ICD-10-CM | POA: Diagnosis not present

## 2021-10-02 DIAGNOSIS — Z96642 Presence of left artificial hip joint: Secondary | ICD-10-CM | POA: Diagnosis not present

## 2021-10-08 DIAGNOSIS — S72302D Unspecified fracture of shaft of left femur, subsequent encounter for closed fracture with routine healing: Secondary | ICD-10-CM | POA: Diagnosis not present

## 2021-10-08 DIAGNOSIS — L84 Corns and callosities: Secondary | ICD-10-CM | POA: Diagnosis not present

## 2021-10-08 DIAGNOSIS — Z9889 Other specified postprocedural states: Secondary | ICD-10-CM | POA: Diagnosis not present

## 2021-10-22 DIAGNOSIS — Z96652 Presence of left artificial knee joint: Secondary | ICD-10-CM | POA: Diagnosis not present

## 2021-10-22 DIAGNOSIS — M85852 Other specified disorders of bone density and structure, left thigh: Secondary | ICD-10-CM | POA: Diagnosis not present

## 2021-10-22 DIAGNOSIS — S72352D Displaced comminuted fracture of shaft of left femur, subsequent encounter for closed fracture with routine healing: Secondary | ICD-10-CM | POA: Diagnosis not present

## 2021-10-22 DIAGNOSIS — S72002D Fracture of unspecified part of neck of left femur, subsequent encounter for closed fracture with routine healing: Secondary | ICD-10-CM | POA: Diagnosis not present

## 2021-10-22 DIAGNOSIS — Z96642 Presence of left artificial hip joint: Secondary | ICD-10-CM | POA: Diagnosis not present

## 2021-11-06 DIAGNOSIS — J4540 Moderate persistent asthma, uncomplicated: Secondary | ICD-10-CM | POA: Diagnosis not present

## 2021-11-06 DIAGNOSIS — Z981 Arthrodesis status: Secondary | ICD-10-CM | POA: Diagnosis not present

## 2021-11-06 DIAGNOSIS — M810 Age-related osteoporosis without current pathological fracture: Secondary | ICD-10-CM | POA: Diagnosis not present

## 2021-11-06 DIAGNOSIS — R0781 Pleurodynia: Secondary | ICD-10-CM | POA: Diagnosis not present

## 2021-11-06 DIAGNOSIS — S2243XA Multiple fractures of ribs, bilateral, initial encounter for closed fracture: Secondary | ICD-10-CM | POA: Diagnosis not present

## 2021-11-06 DIAGNOSIS — S32039A Unspecified fracture of third lumbar vertebra, initial encounter for closed fracture: Secondary | ICD-10-CM | POA: Diagnosis not present

## 2021-11-06 DIAGNOSIS — S2242XA Multiple fractures of ribs, left side, initial encounter for closed fracture: Secondary | ICD-10-CM | POA: Diagnosis not present

## 2021-12-05 DIAGNOSIS — M85852 Other specified disorders of bone density and structure, left thigh: Secondary | ICD-10-CM | POA: Diagnosis not present

## 2021-12-05 DIAGNOSIS — S82201D Unspecified fracture of shaft of right tibia, subsequent encounter for closed fracture with routine healing: Secondary | ICD-10-CM | POA: Diagnosis not present

## 2021-12-05 DIAGNOSIS — S82401D Unspecified fracture of shaft of right fibula, subsequent encounter for closed fracture with routine healing: Secondary | ICD-10-CM | POA: Diagnosis not present

## 2021-12-05 DIAGNOSIS — Z96642 Presence of left artificial hip joint: Secondary | ICD-10-CM | POA: Diagnosis not present

## 2021-12-05 DIAGNOSIS — S72002D Fracture of unspecified part of neck of left femur, subsequent encounter for closed fracture with routine healing: Secondary | ICD-10-CM | POA: Diagnosis not present

## 2021-12-10 DIAGNOSIS — J301 Allergic rhinitis due to pollen: Secondary | ICD-10-CM | POA: Diagnosis not present

## 2021-12-17 DIAGNOSIS — J301 Allergic rhinitis due to pollen: Secondary | ICD-10-CM | POA: Diagnosis not present

## 2021-12-24 DIAGNOSIS — R0781 Pleurodynia: Secondary | ICD-10-CM | POA: Diagnosis not present

## 2021-12-24 DIAGNOSIS — Z8781 Personal history of (healed) traumatic fracture: Secondary | ICD-10-CM | POA: Diagnosis not present

## 2021-12-24 DIAGNOSIS — S2243XA Multiple fractures of ribs, bilateral, initial encounter for closed fracture: Secondary | ICD-10-CM | POA: Diagnosis not present

## 2021-12-24 DIAGNOSIS — J301 Allergic rhinitis due to pollen: Secondary | ICD-10-CM | POA: Diagnosis not present

## 2021-12-24 DIAGNOSIS — M810 Age-related osteoporosis without current pathological fracture: Secondary | ICD-10-CM | POA: Diagnosis not present

## 2021-12-24 DIAGNOSIS — Z981 Arthrodesis status: Secondary | ICD-10-CM | POA: Diagnosis not present

## 2021-12-31 DIAGNOSIS — J301 Allergic rhinitis due to pollen: Secondary | ICD-10-CM | POA: Diagnosis not present

## 2022-01-03 DIAGNOSIS — S72002D Fracture of unspecified part of neck of left femur, subsequent encounter for closed fracture with routine healing: Secondary | ICD-10-CM | POA: Diagnosis not present

## 2022-01-03 DIAGNOSIS — S72092D Other fracture of head and neck of left femur, subsequent encounter for closed fracture with routine healing: Secondary | ICD-10-CM | POA: Diagnosis not present

## 2022-01-03 DIAGNOSIS — M85852 Other specified disorders of bone density and structure, left thigh: Secondary | ICD-10-CM | POA: Diagnosis not present

## 2022-01-03 DIAGNOSIS — Z8781 Personal history of (healed) traumatic fracture: Secondary | ICD-10-CM | POA: Diagnosis not present

## 2022-01-03 DIAGNOSIS — M25552 Pain in left hip: Secondary | ICD-10-CM | POA: Diagnosis not present

## 2022-01-03 DIAGNOSIS — Z9889 Other specified postprocedural states: Secondary | ICD-10-CM | POA: Diagnosis not present

## 2022-01-07 DIAGNOSIS — Z8781 Personal history of (healed) traumatic fracture: Secondary | ICD-10-CM | POA: Diagnosis not present

## 2022-01-07 DIAGNOSIS — S72002D Fracture of unspecified part of neck of left femur, subsequent encounter for closed fracture with routine healing: Secondary | ICD-10-CM | POA: Diagnosis not present

## 2022-01-07 DIAGNOSIS — Z96642 Presence of left artificial hip joint: Secondary | ICD-10-CM | POA: Diagnosis not present

## 2022-01-07 DIAGNOSIS — J301 Allergic rhinitis due to pollen: Secondary | ICD-10-CM | POA: Diagnosis not present

## 2022-01-07 DIAGNOSIS — T84195D Other mechanical complication of internal fixation device of left femur, subsequent encounter: Secondary | ICD-10-CM | POA: Diagnosis not present

## 2022-01-07 DIAGNOSIS — Z9689 Presence of other specified functional implants: Secondary | ICD-10-CM | POA: Diagnosis not present

## 2022-01-07 DIAGNOSIS — Z96652 Presence of left artificial knee joint: Secondary | ICD-10-CM | POA: Diagnosis not present

## 2022-01-07 DIAGNOSIS — M85862 Other specified disorders of bone density and structure, left lower leg: Secondary | ICD-10-CM | POA: Diagnosis not present

## 2022-01-07 DIAGNOSIS — S72352D Displaced comminuted fracture of shaft of left femur, subsequent encounter for closed fracture with routine healing: Secondary | ICD-10-CM | POA: Diagnosis not present

## 2022-01-10 DIAGNOSIS — G4733 Obstructive sleep apnea (adult) (pediatric): Secondary | ICD-10-CM | POA: Diagnosis not present

## 2022-01-10 DIAGNOSIS — J4540 Moderate persistent asthma, uncomplicated: Secondary | ICD-10-CM | POA: Diagnosis not present

## 2022-01-22 DIAGNOSIS — M6281 Muscle weakness (generalized): Secondary | ICD-10-CM | POA: Diagnosis not present

## 2022-01-22 DIAGNOSIS — Z472 Encounter for removal of internal fixation device: Secondary | ICD-10-CM | POA: Diagnosis not present

## 2022-01-22 DIAGNOSIS — B9689 Other specified bacterial agents as the cause of diseases classified elsewhere: Secondary | ICD-10-CM | POA: Diagnosis not present

## 2022-01-22 DIAGNOSIS — Z96642 Presence of left artificial hip joint: Secondary | ICD-10-CM | POA: Diagnosis not present

## 2022-01-22 DIAGNOSIS — S72302D Unspecified fracture of shaft of left femur, subsequent encounter for closed fracture with routine healing: Secondary | ICD-10-CM | POA: Diagnosis not present

## 2022-01-22 DIAGNOSIS — Z4789 Encounter for other orthopedic aftercare: Secondary | ICD-10-CM | POA: Diagnosis not present

## 2022-01-22 DIAGNOSIS — R531 Weakness: Secondary | ICD-10-CM | POA: Diagnosis not present

## 2022-01-22 DIAGNOSIS — D649 Anemia, unspecified: Secondary | ICD-10-CM | POA: Diagnosis not present

## 2022-01-22 DIAGNOSIS — A498 Other bacterial infections of unspecified site: Secondary | ICD-10-CM | POA: Diagnosis not present

## 2022-01-22 DIAGNOSIS — I959 Hypotension, unspecified: Secondary | ICD-10-CM | POA: Diagnosis not present

## 2022-01-22 DIAGNOSIS — R2681 Unsteadiness on feet: Secondary | ICD-10-CM | POA: Diagnosis not present

## 2022-01-22 DIAGNOSIS — S72002K Fracture of unspecified part of neck of left femur, subsequent encounter for closed fracture with nonunion: Secondary | ICD-10-CM | POA: Diagnosis not present

## 2022-01-22 DIAGNOSIS — M79605 Pain in left leg: Secondary | ICD-10-CM | POA: Diagnosis not present

## 2022-01-22 DIAGNOSIS — I499 Cardiac arrhythmia, unspecified: Secondary | ICD-10-CM | POA: Diagnosis not present

## 2022-01-22 DIAGNOSIS — R5381 Other malaise: Secondary | ICD-10-CM | POA: Diagnosis not present

## 2022-01-22 DIAGNOSIS — Z969 Presence of functional implant, unspecified: Secondary | ICD-10-CM | POA: Diagnosis not present

## 2022-01-22 DIAGNOSIS — I10 Essential (primary) hypertension: Secondary | ICD-10-CM | POA: Diagnosis not present

## 2022-01-22 DIAGNOSIS — Z96652 Presence of left artificial knee joint: Secondary | ICD-10-CM | POA: Diagnosis not present

## 2022-01-22 DIAGNOSIS — S72002D Fracture of unspecified part of neck of left femur, subsequent encounter for closed fracture with routine healing: Secondary | ICD-10-CM | POA: Diagnosis not present

## 2022-01-22 DIAGNOSIS — R0902 Hypoxemia: Secondary | ICD-10-CM | POA: Diagnosis not present

## 2022-01-22 DIAGNOSIS — J449 Chronic obstructive pulmonary disease, unspecified: Secondary | ICD-10-CM | POA: Diagnosis not present

## 2022-01-22 DIAGNOSIS — Z8781 Personal history of (healed) traumatic fracture: Secondary | ICD-10-CM | POA: Diagnosis not present

## 2022-01-22 DIAGNOSIS — B957 Other staphylococcus as the cause of diseases classified elsewhere: Secondary | ICD-10-CM | POA: Diagnosis not present

## 2022-01-22 DIAGNOSIS — T8489XA Other specified complication of internal orthopedic prosthetic devices, implants and grafts, initial encounter: Secondary | ICD-10-CM | POA: Diagnosis not present

## 2022-01-22 DIAGNOSIS — S72302K Unspecified fracture of shaft of left femur, subsequent encounter for closed fracture with nonunion: Secondary | ICD-10-CM | POA: Diagnosis not present

## 2022-01-22 DIAGNOSIS — S72302P Unspecified fracture of shaft of left femur, subsequent encounter for closed fracture with malunion: Secondary | ICD-10-CM | POA: Diagnosis not present

## 2022-01-22 DIAGNOSIS — R339 Retention of urine, unspecified: Secondary | ICD-10-CM | POA: Diagnosis not present

## 2022-01-22 DIAGNOSIS — J45909 Unspecified asthma, uncomplicated: Secondary | ICD-10-CM | POA: Diagnosis not present

## 2022-01-22 DIAGNOSIS — S7292XK Unspecified fracture of left femur, subsequent encounter for closed fracture with nonunion: Secondary | ICD-10-CM | POA: Diagnosis not present

## 2022-01-22 DIAGNOSIS — Z743 Need for continuous supervision: Secondary | ICD-10-CM | POA: Diagnosis not present

## 2022-01-28 DIAGNOSIS — I10 Essential (primary) hypertension: Secondary | ICD-10-CM | POA: Diagnosis not present

## 2022-01-28 DIAGNOSIS — R531 Weakness: Secondary | ICD-10-CM | POA: Diagnosis not present

## 2022-01-28 DIAGNOSIS — I499 Cardiac arrhythmia, unspecified: Secondary | ICD-10-CM | POA: Diagnosis not present

## 2022-01-28 DIAGNOSIS — S72302D Unspecified fracture of shaft of left femur, subsequent encounter for closed fracture with routine healing: Secondary | ICD-10-CM | POA: Diagnosis not present

## 2022-01-28 DIAGNOSIS — E876 Hypokalemia: Secondary | ICD-10-CM | POA: Diagnosis not present

## 2022-01-28 DIAGNOSIS — Z9660 Presence of unspecified orthopedic joint implant: Secondary | ICD-10-CM | POA: Diagnosis not present

## 2022-01-28 DIAGNOSIS — Z4789 Encounter for other orthopedic aftercare: Secondary | ICD-10-CM | POA: Diagnosis not present

## 2022-01-28 DIAGNOSIS — M898X8 Other specified disorders of bone, other site: Secondary | ICD-10-CM | POA: Diagnosis not present

## 2022-01-28 DIAGNOSIS — M47812 Spondylosis without myelopathy or radiculopathy, cervical region: Secondary | ICD-10-CM | POA: Diagnosis not present

## 2022-01-28 DIAGNOSIS — J449 Chronic obstructive pulmonary disease, unspecified: Secondary | ICD-10-CM | POA: Diagnosis not present

## 2022-01-28 DIAGNOSIS — S72009A Fracture of unspecified part of neck of unspecified femur, initial encounter for closed fracture: Secondary | ICD-10-CM | POA: Diagnosis not present

## 2022-01-28 DIAGNOSIS — M86152 Other acute osteomyelitis, left femur: Secondary | ICD-10-CM | POA: Diagnosis not present

## 2022-01-28 DIAGNOSIS — S72002K Fracture of unspecified part of neck of left femur, subsequent encounter for closed fracture with nonunion: Secondary | ICD-10-CM | POA: Diagnosis not present

## 2022-01-28 DIAGNOSIS — R5381 Other malaise: Secondary | ICD-10-CM | POA: Diagnosis not present

## 2022-01-28 DIAGNOSIS — B958 Unspecified staphylococcus as the cause of diseases classified elsewhere: Secondary | ICD-10-CM | POA: Diagnosis not present

## 2022-01-28 DIAGNOSIS — R5383 Other fatigue: Secondary | ICD-10-CM | POA: Diagnosis not present

## 2022-01-28 DIAGNOSIS — M6281 Muscle weakness (generalized): Secondary | ICD-10-CM | POA: Diagnosis not present

## 2022-01-28 DIAGNOSIS — Z09 Encounter for follow-up examination after completed treatment for conditions other than malignant neoplasm: Secondary | ICD-10-CM | POA: Diagnosis not present

## 2022-01-28 DIAGNOSIS — Z978 Presence of other specified devices: Secondary | ICD-10-CM | POA: Diagnosis not present

## 2022-01-28 DIAGNOSIS — Z5181 Encounter for therapeutic drug level monitoring: Secondary | ICD-10-CM | POA: Diagnosis not present

## 2022-01-28 DIAGNOSIS — M79605 Pain in left leg: Secondary | ICD-10-CM | POA: Diagnosis not present

## 2022-01-28 DIAGNOSIS — R2681 Unsteadiness on feet: Secondary | ICD-10-CM | POA: Diagnosis not present

## 2022-01-28 DIAGNOSIS — Z23 Encounter for immunization: Secondary | ICD-10-CM | POA: Diagnosis not present

## 2022-01-28 DIAGNOSIS — Z743 Need for continuous supervision: Secondary | ICD-10-CM | POA: Diagnosis not present

## 2022-01-28 DIAGNOSIS — D649 Anemia, unspecified: Secondary | ICD-10-CM | POA: Diagnosis not present

## 2022-01-28 DIAGNOSIS — R339 Retention of urine, unspecified: Secondary | ICD-10-CM | POA: Diagnosis not present

## 2022-01-28 DIAGNOSIS — Z9889 Other specified postprocedural states: Secondary | ICD-10-CM | POA: Diagnosis not present

## 2022-01-28 DIAGNOSIS — J45909 Unspecified asthma, uncomplicated: Secondary | ICD-10-CM | POA: Diagnosis not present

## 2022-01-28 DIAGNOSIS — T148XXA Other injury of unspecified body region, initial encounter: Secondary | ICD-10-CM | POA: Diagnosis not present

## 2022-01-28 DIAGNOSIS — A498 Other bacterial infections of unspecified site: Secondary | ICD-10-CM | POA: Diagnosis not present

## 2022-01-28 DIAGNOSIS — Z792 Long term (current) use of antibiotics: Secondary | ICD-10-CM | POA: Diagnosis not present

## 2022-01-28 DIAGNOSIS — Z79899 Other long term (current) drug therapy: Secondary | ICD-10-CM | POA: Diagnosis not present

## 2022-01-28 DIAGNOSIS — Z8781 Personal history of (healed) traumatic fracture: Secondary | ICD-10-CM | POA: Diagnosis not present

## 2022-01-28 DIAGNOSIS — S7292XK Unspecified fracture of left femur, subsequent encounter for closed fracture with nonunion: Secondary | ICD-10-CM | POA: Diagnosis not present

## 2022-01-28 DIAGNOSIS — S72002D Fracture of unspecified part of neck of left femur, subsequent encounter for closed fracture with routine healing: Secondary | ICD-10-CM | POA: Diagnosis not present

## 2022-01-28 DIAGNOSIS — R0902 Hypoxemia: Secondary | ICD-10-CM | POA: Diagnosis not present

## 2022-01-29 DIAGNOSIS — D649 Anemia, unspecified: Secondary | ICD-10-CM | POA: Diagnosis not present

## 2022-01-29 DIAGNOSIS — J449 Chronic obstructive pulmonary disease, unspecified: Secondary | ICD-10-CM | POA: Diagnosis not present

## 2022-01-29 DIAGNOSIS — I10 Essential (primary) hypertension: Secondary | ICD-10-CM | POA: Diagnosis not present

## 2022-01-29 DIAGNOSIS — R339 Retention of urine, unspecified: Secondary | ICD-10-CM | POA: Diagnosis not present

## 2022-01-29 DIAGNOSIS — A498 Other bacterial infections of unspecified site: Secondary | ICD-10-CM | POA: Diagnosis not present

## 2022-01-29 DIAGNOSIS — Z8781 Personal history of (healed) traumatic fracture: Secondary | ICD-10-CM | POA: Diagnosis not present

## 2022-01-29 DIAGNOSIS — S72009A Fracture of unspecified part of neck of unspecified femur, initial encounter for closed fracture: Secondary | ICD-10-CM | POA: Diagnosis not present

## 2022-02-03 DIAGNOSIS — Z978 Presence of other specified devices: Secondary | ICD-10-CM | POA: Diagnosis not present

## 2022-02-03 DIAGNOSIS — Z5181 Encounter for therapeutic drug level monitoring: Secondary | ICD-10-CM | POA: Diagnosis not present

## 2022-02-03 DIAGNOSIS — A498 Other bacterial infections of unspecified site: Secondary | ICD-10-CM | POA: Diagnosis not present

## 2022-02-03 DIAGNOSIS — J449 Chronic obstructive pulmonary disease, unspecified: Secondary | ICD-10-CM | POA: Diagnosis not present

## 2022-02-03 DIAGNOSIS — Z79899 Other long term (current) drug therapy: Secondary | ICD-10-CM | POA: Diagnosis not present

## 2022-02-03 DIAGNOSIS — Z09 Encounter for follow-up examination after completed treatment for conditions other than malignant neoplasm: Secondary | ICD-10-CM | POA: Diagnosis not present

## 2022-02-03 DIAGNOSIS — B958 Unspecified staphylococcus as the cause of diseases classified elsewhere: Secondary | ICD-10-CM | POA: Diagnosis not present

## 2022-02-03 DIAGNOSIS — M86152 Other acute osteomyelitis, left femur: Secondary | ICD-10-CM | POA: Diagnosis not present

## 2022-02-03 DIAGNOSIS — Z792 Long term (current) use of antibiotics: Secondary | ICD-10-CM | POA: Diagnosis not present

## 2022-02-03 DIAGNOSIS — M79605 Pain in left leg: Secondary | ICD-10-CM | POA: Diagnosis not present

## 2022-02-03 DIAGNOSIS — Z9660 Presence of unspecified orthopedic joint implant: Secondary | ICD-10-CM | POA: Diagnosis not present

## 2022-02-03 DIAGNOSIS — I10 Essential (primary) hypertension: Secondary | ICD-10-CM | POA: Diagnosis not present

## 2022-02-03 DIAGNOSIS — R5381 Other malaise: Secondary | ICD-10-CM | POA: Diagnosis not present

## 2022-02-05 DIAGNOSIS — S72009A Fracture of unspecified part of neck of unspecified femur, initial encounter for closed fracture: Secondary | ICD-10-CM | POA: Diagnosis not present

## 2022-02-05 DIAGNOSIS — D649 Anemia, unspecified: Secondary | ICD-10-CM | POA: Diagnosis not present

## 2022-02-05 DIAGNOSIS — E876 Hypokalemia: Secondary | ICD-10-CM | POA: Diagnosis not present

## 2022-02-06 DIAGNOSIS — R5383 Other fatigue: Secondary | ICD-10-CM | POA: Diagnosis not present

## 2022-02-06 DIAGNOSIS — D649 Anemia, unspecified: Secondary | ICD-10-CM | POA: Diagnosis not present

## 2022-02-10 DIAGNOSIS — S7292XK Unspecified fracture of left femur, subsequent encounter for closed fracture with nonunion: Secondary | ICD-10-CM | POA: Diagnosis not present

## 2022-02-11 DIAGNOSIS — M79605 Pain in left leg: Secondary | ICD-10-CM | POA: Diagnosis not present

## 2022-02-11 DIAGNOSIS — S72002D Fracture of unspecified part of neck of left femur, subsequent encounter for closed fracture with routine healing: Secondary | ICD-10-CM | POA: Diagnosis not present

## 2022-02-11 DIAGNOSIS — S72002K Fracture of unspecified part of neck of left femur, subsequent encounter for closed fracture with nonunion: Secondary | ICD-10-CM | POA: Diagnosis not present

## 2022-02-11 DIAGNOSIS — J45909 Unspecified asthma, uncomplicated: Secondary | ICD-10-CM | POA: Diagnosis not present

## 2022-02-11 DIAGNOSIS — A498 Other bacterial infections of unspecified site: Secondary | ICD-10-CM | POA: Diagnosis not present

## 2022-02-11 DIAGNOSIS — R5381 Other malaise: Secondary | ICD-10-CM | POA: Diagnosis not present

## 2022-02-11 DIAGNOSIS — I10 Essential (primary) hypertension: Secondary | ICD-10-CM | POA: Diagnosis not present

## 2022-02-11 DIAGNOSIS — R339 Retention of urine, unspecified: Secondary | ICD-10-CM | POA: Diagnosis not present

## 2022-02-11 DIAGNOSIS — Z8781 Personal history of (healed) traumatic fracture: Secondary | ICD-10-CM | POA: Diagnosis not present

## 2022-02-11 DIAGNOSIS — S72302D Unspecified fracture of shaft of left femur, subsequent encounter for closed fracture with routine healing: Secondary | ICD-10-CM | POA: Diagnosis not present

## 2022-02-11 DIAGNOSIS — M6281 Muscle weakness (generalized): Secondary | ICD-10-CM | POA: Diagnosis not present

## 2022-02-11 DIAGNOSIS — Z23 Encounter for immunization: Secondary | ICD-10-CM | POA: Diagnosis not present

## 2022-02-11 DIAGNOSIS — D649 Anemia, unspecified: Secondary | ICD-10-CM | POA: Diagnosis not present

## 2022-02-11 DIAGNOSIS — J449 Chronic obstructive pulmonary disease, unspecified: Secondary | ICD-10-CM | POA: Diagnosis not present

## 2022-02-11 DIAGNOSIS — R2681 Unsteadiness on feet: Secondary | ICD-10-CM | POA: Diagnosis not present

## 2022-02-17 DIAGNOSIS — T148XXA Other injury of unspecified body region, initial encounter: Secondary | ICD-10-CM | POA: Diagnosis not present

## 2022-02-19 DIAGNOSIS — M898X8 Other specified disorders of bone, other site: Secondary | ICD-10-CM | POA: Diagnosis not present

## 2022-02-19 DIAGNOSIS — M47812 Spondylosis without myelopathy or radiculopathy, cervical region: Secondary | ICD-10-CM | POA: Diagnosis not present

## 2022-02-19 DIAGNOSIS — S72009A Fracture of unspecified part of neck of unspecified femur, initial encounter for closed fracture: Secondary | ICD-10-CM | POA: Diagnosis not present

## 2022-02-19 DIAGNOSIS — T148XXA Other injury of unspecified body region, initial encounter: Secondary | ICD-10-CM | POA: Diagnosis not present

## 2022-02-24 DIAGNOSIS — Z978 Presence of other specified devices: Secondary | ICD-10-CM | POA: Diagnosis not present

## 2022-02-24 DIAGNOSIS — J449 Chronic obstructive pulmonary disease, unspecified: Secondary | ICD-10-CM | POA: Diagnosis not present

## 2022-02-24 DIAGNOSIS — Z792 Long term (current) use of antibiotics: Secondary | ICD-10-CM | POA: Diagnosis not present

## 2022-02-24 DIAGNOSIS — Z9889 Other specified postprocedural states: Secondary | ICD-10-CM | POA: Diagnosis not present

## 2022-02-24 DIAGNOSIS — I10 Essential (primary) hypertension: Secondary | ICD-10-CM | POA: Diagnosis not present

## 2022-02-24 DIAGNOSIS — Z79899 Other long term (current) drug therapy: Secondary | ICD-10-CM | POA: Diagnosis not present

## 2022-02-24 DIAGNOSIS — A498 Other bacterial infections of unspecified site: Secondary | ICD-10-CM | POA: Diagnosis not present

## 2022-02-24 DIAGNOSIS — M86152 Other acute osteomyelitis, left femur: Secondary | ICD-10-CM | POA: Diagnosis not present

## 2022-02-25 DIAGNOSIS — D649 Anemia, unspecified: Secondary | ICD-10-CM | POA: Diagnosis not present

## 2022-02-25 DIAGNOSIS — I10 Essential (primary) hypertension: Secondary | ICD-10-CM | POA: Diagnosis not present

## 2022-02-25 DIAGNOSIS — A498 Other bacterial infections of unspecified site: Secondary | ICD-10-CM | POA: Diagnosis not present

## 2022-02-25 DIAGNOSIS — T148XXA Other injury of unspecified body region, initial encounter: Secondary | ICD-10-CM | POA: Diagnosis not present

## 2022-02-25 DIAGNOSIS — J449 Chronic obstructive pulmonary disease, unspecified: Secondary | ICD-10-CM | POA: Diagnosis not present

## 2022-03-06 DIAGNOSIS — M79605 Pain in left leg: Secondary | ICD-10-CM | POA: Diagnosis not present

## 2022-03-06 DIAGNOSIS — J449 Chronic obstructive pulmonary disease, unspecified: Secondary | ICD-10-CM | POA: Diagnosis not present

## 2022-03-06 DIAGNOSIS — I10 Essential (primary) hypertension: Secondary | ICD-10-CM | POA: Diagnosis not present

## 2022-03-12 DIAGNOSIS — R2681 Unsteadiness on feet: Secondary | ICD-10-CM | POA: Diagnosis not present

## 2022-03-12 DIAGNOSIS — M79605 Pain in left leg: Secondary | ICD-10-CM | POA: Diagnosis not present

## 2022-03-12 DIAGNOSIS — S72002D Fracture of unspecified part of neck of left femur, subsequent encounter for closed fracture with routine healing: Secondary | ICD-10-CM | POA: Diagnosis not present

## 2022-03-12 DIAGNOSIS — S72302D Unspecified fracture of shaft of left femur, subsequent encounter for closed fracture with routine healing: Secondary | ICD-10-CM | POA: Diagnosis not present

## 2022-03-12 DIAGNOSIS — S72002K Fracture of unspecified part of neck of left femur, subsequent encounter for closed fracture with nonunion: Secondary | ICD-10-CM | POA: Diagnosis not present

## 2022-03-12 DIAGNOSIS — S72009A Fracture of unspecified part of neck of unspecified femur, initial encounter for closed fracture: Secondary | ICD-10-CM | POA: Diagnosis not present

## 2022-03-12 DIAGNOSIS — I10 Essential (primary) hypertension: Secondary | ICD-10-CM | POA: Diagnosis not present

## 2022-03-12 DIAGNOSIS — A498 Other bacterial infections of unspecified site: Secondary | ICD-10-CM | POA: Diagnosis not present

## 2022-03-12 DIAGNOSIS — D649 Anemia, unspecified: Secondary | ICD-10-CM | POA: Diagnosis not present

## 2022-03-12 DIAGNOSIS — R339 Retention of urine, unspecified: Secondary | ICD-10-CM | POA: Diagnosis not present

## 2022-03-12 DIAGNOSIS — R5381 Other malaise: Secondary | ICD-10-CM | POA: Diagnosis not present

## 2022-03-12 DIAGNOSIS — Z4789 Encounter for other orthopedic aftercare: Secondary | ICD-10-CM | POA: Diagnosis not present

## 2022-03-12 DIAGNOSIS — M6281 Muscle weakness (generalized): Secondary | ICD-10-CM | POA: Diagnosis not present

## 2022-03-12 DIAGNOSIS — Z8781 Personal history of (healed) traumatic fracture: Secondary | ICD-10-CM | POA: Diagnosis not present

## 2022-03-12 DIAGNOSIS — J45909 Unspecified asthma, uncomplicated: Secondary | ICD-10-CM | POA: Diagnosis not present

## 2022-03-12 DIAGNOSIS — J449 Chronic obstructive pulmonary disease, unspecified: Secondary | ICD-10-CM | POA: Diagnosis not present

## 2022-03-27 DIAGNOSIS — Z96642 Presence of left artificial hip joint: Secondary | ICD-10-CM | POA: Diagnosis not present

## 2022-03-27 DIAGNOSIS — Z880 Allergy status to penicillin: Secondary | ICD-10-CM | POA: Diagnosis not present

## 2022-03-27 DIAGNOSIS — S7292XK Unspecified fracture of left femur, subsequent encounter for closed fracture with nonunion: Secondary | ICD-10-CM | POA: Diagnosis not present

## 2022-03-27 DIAGNOSIS — Z885 Allergy status to narcotic agent status: Secondary | ICD-10-CM | POA: Diagnosis not present

## 2022-03-27 DIAGNOSIS — Z882 Allergy status to sulfonamides status: Secondary | ICD-10-CM | POA: Diagnosis not present

## 2022-03-27 DIAGNOSIS — Z96652 Presence of left artificial knee joint: Secondary | ICD-10-CM | POA: Diagnosis not present

## 2022-03-27 DIAGNOSIS — M8588 Other specified disorders of bone density and structure, other site: Secondary | ICD-10-CM | POA: Diagnosis not present

## 2022-03-27 DIAGNOSIS — Z886 Allergy status to analgesic agent status: Secondary | ICD-10-CM | POA: Diagnosis not present

## 2022-04-10 DIAGNOSIS — S72002K Fracture of unspecified part of neck of left femur, subsequent encounter for closed fracture with nonunion: Secondary | ICD-10-CM | POA: Diagnosis not present

## 2022-04-10 DIAGNOSIS — J4489 Other specified chronic obstructive pulmonary disease: Secondary | ICD-10-CM | POA: Diagnosis not present

## 2022-04-10 DIAGNOSIS — Z87440 Personal history of urinary (tract) infections: Secondary | ICD-10-CM | POA: Diagnosis not present

## 2022-04-10 DIAGNOSIS — S72302K Unspecified fracture of shaft of left femur, subsequent encounter for closed fracture with nonunion: Secondary | ICD-10-CM | POA: Diagnosis not present

## 2022-04-10 DIAGNOSIS — Z792 Long term (current) use of antibiotics: Secondary | ICD-10-CM | POA: Diagnosis not present

## 2022-04-10 DIAGNOSIS — Z791 Long term (current) use of non-steroidal anti-inflammatories (NSAID): Secondary | ICD-10-CM | POA: Diagnosis not present

## 2022-04-10 DIAGNOSIS — Z9181 History of falling: Secondary | ICD-10-CM | POA: Diagnosis not present

## 2022-04-10 DIAGNOSIS — Z7951 Long term (current) use of inhaled steroids: Secondary | ICD-10-CM | POA: Diagnosis not present

## 2022-04-10 DIAGNOSIS — M84374D Stress fracture, right foot, subsequent encounter for fracture with routine healing: Secondary | ICD-10-CM | POA: Diagnosis not present

## 2022-04-10 DIAGNOSIS — M2010 Hallux valgus (acquired), unspecified foot: Secondary | ICD-10-CM | POA: Diagnosis not present

## 2022-04-10 DIAGNOSIS — F32A Depression, unspecified: Secondary | ICD-10-CM | POA: Diagnosis not present

## 2022-04-10 DIAGNOSIS — M67919 Unspecified disorder of synovium and tendon, unspecified shoulder: Secondary | ICD-10-CM | POA: Diagnosis not present

## 2022-04-10 DIAGNOSIS — T84091D Other mechanical complication of internal left hip prosthesis, subsequent encounter: Secondary | ICD-10-CM | POA: Diagnosis not present

## 2022-04-10 DIAGNOSIS — I10 Essential (primary) hypertension: Secondary | ICD-10-CM | POA: Diagnosis not present

## 2022-04-10 DIAGNOSIS — M545 Low back pain, unspecified: Secondary | ICD-10-CM | POA: Diagnosis not present

## 2022-04-14 DIAGNOSIS — M67919 Unspecified disorder of synovium and tendon, unspecified shoulder: Secondary | ICD-10-CM | POA: Diagnosis not present

## 2022-04-14 DIAGNOSIS — Z792 Long term (current) use of antibiotics: Secondary | ICD-10-CM | POA: Diagnosis not present

## 2022-04-14 DIAGNOSIS — S72002K Fracture of unspecified part of neck of left femur, subsequent encounter for closed fracture with nonunion: Secondary | ICD-10-CM | POA: Diagnosis not present

## 2022-04-14 DIAGNOSIS — S72302K Unspecified fracture of shaft of left femur, subsequent encounter for closed fracture with nonunion: Secondary | ICD-10-CM | POA: Diagnosis not present

## 2022-04-14 DIAGNOSIS — Z9181 History of falling: Secondary | ICD-10-CM | POA: Diagnosis not present

## 2022-04-14 DIAGNOSIS — F32A Depression, unspecified: Secondary | ICD-10-CM | POA: Diagnosis not present

## 2022-04-14 DIAGNOSIS — J4489 Other specified chronic obstructive pulmonary disease: Secondary | ICD-10-CM | POA: Diagnosis not present

## 2022-04-14 DIAGNOSIS — Z87440 Personal history of urinary (tract) infections: Secondary | ICD-10-CM | POA: Diagnosis not present

## 2022-04-14 DIAGNOSIS — Z791 Long term (current) use of non-steroidal anti-inflammatories (NSAID): Secondary | ICD-10-CM | POA: Diagnosis not present

## 2022-04-14 DIAGNOSIS — M84374D Stress fracture, right foot, subsequent encounter for fracture with routine healing: Secondary | ICD-10-CM | POA: Diagnosis not present

## 2022-04-14 DIAGNOSIS — M545 Low back pain, unspecified: Secondary | ICD-10-CM | POA: Diagnosis not present

## 2022-04-14 DIAGNOSIS — I10 Essential (primary) hypertension: Secondary | ICD-10-CM | POA: Diagnosis not present

## 2022-04-14 DIAGNOSIS — M2010 Hallux valgus (acquired), unspecified foot: Secondary | ICD-10-CM | POA: Diagnosis not present

## 2022-04-14 DIAGNOSIS — T84091D Other mechanical complication of internal left hip prosthesis, subsequent encounter: Secondary | ICD-10-CM | POA: Diagnosis not present

## 2022-04-14 DIAGNOSIS — Z7951 Long term (current) use of inhaled steroids: Secondary | ICD-10-CM | POA: Diagnosis not present

## 2022-04-17 DIAGNOSIS — T84091D Other mechanical complication of internal left hip prosthesis, subsequent encounter: Secondary | ICD-10-CM | POA: Diagnosis not present

## 2022-04-17 DIAGNOSIS — I10 Essential (primary) hypertension: Secondary | ICD-10-CM | POA: Diagnosis not present

## 2022-04-17 DIAGNOSIS — Z7951 Long term (current) use of inhaled steroids: Secondary | ICD-10-CM | POA: Diagnosis not present

## 2022-04-17 DIAGNOSIS — M67919 Unspecified disorder of synovium and tendon, unspecified shoulder: Secondary | ICD-10-CM | POA: Diagnosis not present

## 2022-04-17 DIAGNOSIS — Z9181 History of falling: Secondary | ICD-10-CM | POA: Diagnosis not present

## 2022-04-17 DIAGNOSIS — J4489 Other specified chronic obstructive pulmonary disease: Secondary | ICD-10-CM | POA: Diagnosis not present

## 2022-04-17 DIAGNOSIS — S72302K Unspecified fracture of shaft of left femur, subsequent encounter for closed fracture with nonunion: Secondary | ICD-10-CM | POA: Diagnosis not present

## 2022-04-17 DIAGNOSIS — M84374D Stress fracture, right foot, subsequent encounter for fracture with routine healing: Secondary | ICD-10-CM | POA: Diagnosis not present

## 2022-04-17 DIAGNOSIS — F32A Depression, unspecified: Secondary | ICD-10-CM | POA: Diagnosis not present

## 2022-04-17 DIAGNOSIS — M545 Low back pain, unspecified: Secondary | ICD-10-CM | POA: Diagnosis not present

## 2022-04-17 DIAGNOSIS — S72002K Fracture of unspecified part of neck of left femur, subsequent encounter for closed fracture with nonunion: Secondary | ICD-10-CM | POA: Diagnosis not present

## 2022-04-17 DIAGNOSIS — Z792 Long term (current) use of antibiotics: Secondary | ICD-10-CM | POA: Diagnosis not present

## 2022-04-17 DIAGNOSIS — M2010 Hallux valgus (acquired), unspecified foot: Secondary | ICD-10-CM | POA: Diagnosis not present

## 2022-04-17 DIAGNOSIS — Z791 Long term (current) use of non-steroidal anti-inflammatories (NSAID): Secondary | ICD-10-CM | POA: Diagnosis not present

## 2022-04-17 DIAGNOSIS — Z87440 Personal history of urinary (tract) infections: Secondary | ICD-10-CM | POA: Diagnosis not present

## 2022-04-21 DIAGNOSIS — Z9181 History of falling: Secondary | ICD-10-CM | POA: Diagnosis not present

## 2022-04-21 DIAGNOSIS — Z87440 Personal history of urinary (tract) infections: Secondary | ICD-10-CM | POA: Diagnosis not present

## 2022-04-21 DIAGNOSIS — T84091D Other mechanical complication of internal left hip prosthesis, subsequent encounter: Secondary | ICD-10-CM | POA: Diagnosis not present

## 2022-04-21 DIAGNOSIS — M67919 Unspecified disorder of synovium and tendon, unspecified shoulder: Secondary | ICD-10-CM | POA: Diagnosis not present

## 2022-04-21 DIAGNOSIS — Z7951 Long term (current) use of inhaled steroids: Secondary | ICD-10-CM | POA: Diagnosis not present

## 2022-04-21 DIAGNOSIS — M545 Low back pain, unspecified: Secondary | ICD-10-CM | POA: Diagnosis not present

## 2022-04-21 DIAGNOSIS — S72002K Fracture of unspecified part of neck of left femur, subsequent encounter for closed fracture with nonunion: Secondary | ICD-10-CM | POA: Diagnosis not present

## 2022-04-21 DIAGNOSIS — S72302K Unspecified fracture of shaft of left femur, subsequent encounter for closed fracture with nonunion: Secondary | ICD-10-CM | POA: Diagnosis not present

## 2022-04-21 DIAGNOSIS — M2010 Hallux valgus (acquired), unspecified foot: Secondary | ICD-10-CM | POA: Diagnosis not present

## 2022-04-21 DIAGNOSIS — Z792 Long term (current) use of antibiotics: Secondary | ICD-10-CM | POA: Diagnosis not present

## 2022-04-21 DIAGNOSIS — M84374D Stress fracture, right foot, subsequent encounter for fracture with routine healing: Secondary | ICD-10-CM | POA: Diagnosis not present

## 2022-04-21 DIAGNOSIS — Z791 Long term (current) use of non-steroidal anti-inflammatories (NSAID): Secondary | ICD-10-CM | POA: Diagnosis not present

## 2022-04-21 DIAGNOSIS — J4489 Other specified chronic obstructive pulmonary disease: Secondary | ICD-10-CM | POA: Diagnosis not present

## 2022-04-21 DIAGNOSIS — F32A Depression, unspecified: Secondary | ICD-10-CM | POA: Diagnosis not present

## 2022-04-21 DIAGNOSIS — I10 Essential (primary) hypertension: Secondary | ICD-10-CM | POA: Diagnosis not present

## 2022-04-22 DIAGNOSIS — M25472 Effusion, left ankle: Secondary | ICD-10-CM | POA: Diagnosis not present

## 2022-04-22 DIAGNOSIS — Z8619 Personal history of other infectious and parasitic diseases: Secondary | ICD-10-CM | POA: Diagnosis not present

## 2022-04-22 DIAGNOSIS — Z8781 Personal history of (healed) traumatic fracture: Secondary | ICD-10-CM | POA: Diagnosis not present

## 2022-04-22 DIAGNOSIS — M8588 Other specified disorders of bone density and structure, other site: Secondary | ICD-10-CM | POA: Diagnosis not present

## 2022-04-22 DIAGNOSIS — M25572 Pain in left ankle and joints of left foot: Secondary | ICD-10-CM | POA: Diagnosis not present

## 2022-04-22 DIAGNOSIS — B958 Unspecified staphylococcus as the cause of diseases classified elsewhere: Secondary | ICD-10-CM | POA: Diagnosis not present

## 2022-04-22 DIAGNOSIS — M7732 Calcaneal spur, left foot: Secondary | ICD-10-CM | POA: Diagnosis not present

## 2022-04-22 DIAGNOSIS — Z79899 Other long term (current) drug therapy: Secondary | ICD-10-CM | POA: Diagnosis not present

## 2022-04-22 DIAGNOSIS — A498 Other bacterial infections of unspecified site: Secondary | ICD-10-CM | POA: Diagnosis not present

## 2022-04-22 DIAGNOSIS — Z978 Presence of other specified devices: Secondary | ICD-10-CM | POA: Diagnosis not present

## 2022-04-22 DIAGNOSIS — Z96698 Presence of other orthopedic joint implants: Secondary | ICD-10-CM | POA: Diagnosis not present

## 2022-04-22 DIAGNOSIS — Z792 Long term (current) use of antibiotics: Secondary | ICD-10-CM | POA: Diagnosis not present

## 2022-04-22 DIAGNOSIS — M86152 Other acute osteomyelitis, left femur: Secondary | ICD-10-CM | POA: Diagnosis not present

## 2022-04-24 DIAGNOSIS — M545 Low back pain, unspecified: Secondary | ICD-10-CM | POA: Diagnosis not present

## 2022-04-24 DIAGNOSIS — Z791 Long term (current) use of non-steroidal anti-inflammatories (NSAID): Secondary | ICD-10-CM | POA: Diagnosis not present

## 2022-04-24 DIAGNOSIS — I10 Essential (primary) hypertension: Secondary | ICD-10-CM | POA: Diagnosis not present

## 2022-04-24 DIAGNOSIS — F32A Depression, unspecified: Secondary | ICD-10-CM | POA: Diagnosis not present

## 2022-04-24 DIAGNOSIS — M67919 Unspecified disorder of synovium and tendon, unspecified shoulder: Secondary | ICD-10-CM | POA: Diagnosis not present

## 2022-04-24 DIAGNOSIS — Z9181 History of falling: Secondary | ICD-10-CM | POA: Diagnosis not present

## 2022-04-24 DIAGNOSIS — S72002K Fracture of unspecified part of neck of left femur, subsequent encounter for closed fracture with nonunion: Secondary | ICD-10-CM | POA: Diagnosis not present

## 2022-04-24 DIAGNOSIS — Z7951 Long term (current) use of inhaled steroids: Secondary | ICD-10-CM | POA: Diagnosis not present

## 2022-04-24 DIAGNOSIS — M2010 Hallux valgus (acquired), unspecified foot: Secondary | ICD-10-CM | POA: Diagnosis not present

## 2022-04-24 DIAGNOSIS — J4489 Other specified chronic obstructive pulmonary disease: Secondary | ICD-10-CM | POA: Diagnosis not present

## 2022-04-24 DIAGNOSIS — T84091D Other mechanical complication of internal left hip prosthesis, subsequent encounter: Secondary | ICD-10-CM | POA: Diagnosis not present

## 2022-04-24 DIAGNOSIS — Z792 Long term (current) use of antibiotics: Secondary | ICD-10-CM | POA: Diagnosis not present

## 2022-04-24 DIAGNOSIS — Z87440 Personal history of urinary (tract) infections: Secondary | ICD-10-CM | POA: Diagnosis not present

## 2022-04-24 DIAGNOSIS — S72302K Unspecified fracture of shaft of left femur, subsequent encounter for closed fracture with nonunion: Secondary | ICD-10-CM | POA: Diagnosis not present

## 2022-04-24 DIAGNOSIS — M84374D Stress fracture, right foot, subsequent encounter for fracture with routine healing: Secondary | ICD-10-CM | POA: Diagnosis not present

## 2022-05-01 DIAGNOSIS — T84091D Other mechanical complication of internal left hip prosthesis, subsequent encounter: Secondary | ICD-10-CM | POA: Diagnosis not present

## 2022-05-01 DIAGNOSIS — Z87440 Personal history of urinary (tract) infections: Secondary | ICD-10-CM | POA: Diagnosis not present

## 2022-05-01 DIAGNOSIS — Z7951 Long term (current) use of inhaled steroids: Secondary | ICD-10-CM | POA: Diagnosis not present

## 2022-05-01 DIAGNOSIS — S72302K Unspecified fracture of shaft of left femur, subsequent encounter for closed fracture with nonunion: Secondary | ICD-10-CM | POA: Diagnosis not present

## 2022-05-01 DIAGNOSIS — S72002K Fracture of unspecified part of neck of left femur, subsequent encounter for closed fracture with nonunion: Secondary | ICD-10-CM | POA: Diagnosis not present

## 2022-05-01 DIAGNOSIS — Z9181 History of falling: Secondary | ICD-10-CM | POA: Diagnosis not present

## 2022-05-01 DIAGNOSIS — M2010 Hallux valgus (acquired), unspecified foot: Secondary | ICD-10-CM | POA: Diagnosis not present

## 2022-05-01 DIAGNOSIS — I10 Essential (primary) hypertension: Secondary | ICD-10-CM | POA: Diagnosis not present

## 2022-05-01 DIAGNOSIS — M67919 Unspecified disorder of synovium and tendon, unspecified shoulder: Secondary | ICD-10-CM | POA: Diagnosis not present

## 2022-05-01 DIAGNOSIS — Z791 Long term (current) use of non-steroidal anti-inflammatories (NSAID): Secondary | ICD-10-CM | POA: Diagnosis not present

## 2022-05-01 DIAGNOSIS — M545 Low back pain, unspecified: Secondary | ICD-10-CM | POA: Diagnosis not present

## 2022-05-01 DIAGNOSIS — F32A Depression, unspecified: Secondary | ICD-10-CM | POA: Diagnosis not present

## 2022-05-01 DIAGNOSIS — J4489 Other specified chronic obstructive pulmonary disease: Secondary | ICD-10-CM | POA: Diagnosis not present

## 2022-05-01 DIAGNOSIS — M84374D Stress fracture, right foot, subsequent encounter for fracture with routine healing: Secondary | ICD-10-CM | POA: Diagnosis not present

## 2022-05-01 DIAGNOSIS — Z792 Long term (current) use of antibiotics: Secondary | ICD-10-CM | POA: Diagnosis not present

## 2022-05-02 DIAGNOSIS — E875 Hyperkalemia: Secondary | ICD-10-CM | POA: Diagnosis not present

## 2022-05-21 DIAGNOSIS — M67919 Unspecified disorder of synovium and tendon, unspecified shoulder: Secondary | ICD-10-CM | POA: Diagnosis not present

## 2022-05-21 DIAGNOSIS — M545 Low back pain, unspecified: Secondary | ICD-10-CM | POA: Diagnosis not present

## 2022-05-21 DIAGNOSIS — I10 Essential (primary) hypertension: Secondary | ICD-10-CM | POA: Diagnosis not present

## 2022-05-21 DIAGNOSIS — M84374D Stress fracture, right foot, subsequent encounter for fracture with routine healing: Secondary | ICD-10-CM | POA: Diagnosis not present

## 2022-05-21 DIAGNOSIS — T84091D Other mechanical complication of internal left hip prosthesis, subsequent encounter: Secondary | ICD-10-CM | POA: Diagnosis not present

## 2022-05-21 DIAGNOSIS — Z791 Long term (current) use of non-steroidal anti-inflammatories (NSAID): Secondary | ICD-10-CM | POA: Diagnosis not present

## 2022-05-21 DIAGNOSIS — S72302K Unspecified fracture of shaft of left femur, subsequent encounter for closed fracture with nonunion: Secondary | ICD-10-CM | POA: Diagnosis not present

## 2022-05-21 DIAGNOSIS — Z87440 Personal history of urinary (tract) infections: Secondary | ICD-10-CM | POA: Diagnosis not present

## 2022-05-21 DIAGNOSIS — M2010 Hallux valgus (acquired), unspecified foot: Secondary | ICD-10-CM | POA: Diagnosis not present

## 2022-05-21 DIAGNOSIS — Z7951 Long term (current) use of inhaled steroids: Secondary | ICD-10-CM | POA: Diagnosis not present

## 2022-05-21 DIAGNOSIS — Z9181 History of falling: Secondary | ICD-10-CM | POA: Diagnosis not present

## 2022-05-21 DIAGNOSIS — F32A Depression, unspecified: Secondary | ICD-10-CM | POA: Diagnosis not present

## 2022-05-21 DIAGNOSIS — S72002K Fracture of unspecified part of neck of left femur, subsequent encounter for closed fracture with nonunion: Secondary | ICD-10-CM | POA: Diagnosis not present

## 2022-05-21 DIAGNOSIS — J4489 Other specified chronic obstructive pulmonary disease: Secondary | ICD-10-CM | POA: Diagnosis not present

## 2022-05-21 DIAGNOSIS — Z792 Long term (current) use of antibiotics: Secondary | ICD-10-CM | POA: Diagnosis not present

## 2022-05-27 DIAGNOSIS — S7292XK Unspecified fracture of left femur, subsequent encounter for closed fracture with nonunion: Secondary | ICD-10-CM | POA: Diagnosis not present

## 2022-05-27 DIAGNOSIS — Z967 Presence of other bone and tendon implants: Secondary | ICD-10-CM | POA: Diagnosis not present

## 2022-05-27 DIAGNOSIS — Z8781 Personal history of (healed) traumatic fracture: Secondary | ICD-10-CM | POA: Diagnosis not present

## 2022-06-06 DIAGNOSIS — M7061 Trochanteric bursitis, right hip: Secondary | ICD-10-CM | POA: Diagnosis not present

## 2022-06-12 DIAGNOSIS — Z9889 Other specified postprocedural states: Secondary | ICD-10-CM | POA: Diagnosis not present

## 2022-06-12 DIAGNOSIS — M2170 Unequal limb length (acquired), unspecified site: Secondary | ICD-10-CM | POA: Diagnosis not present

## 2022-06-12 DIAGNOSIS — M779 Enthesopathy, unspecified: Secondary | ICD-10-CM | POA: Diagnosis not present

## 2022-06-12 DIAGNOSIS — M25572 Pain in left ankle and joints of left foot: Secondary | ICD-10-CM | POA: Diagnosis not present

## 2022-07-10 DIAGNOSIS — J019 Acute sinusitis, unspecified: Secondary | ICD-10-CM | POA: Diagnosis not present

## 2022-07-10 DIAGNOSIS — G894 Chronic pain syndrome: Secondary | ICD-10-CM | POA: Diagnosis not present

## 2022-07-24 DIAGNOSIS — A498 Other bacterial infections of unspecified site: Secondary | ICD-10-CM | POA: Diagnosis not present

## 2022-07-24 DIAGNOSIS — Z792 Long term (current) use of antibiotics: Secondary | ICD-10-CM | POA: Diagnosis not present

## 2022-07-24 DIAGNOSIS — Z978 Presence of other specified devices: Secondary | ICD-10-CM | POA: Diagnosis not present

## 2022-07-24 DIAGNOSIS — M86252 Subacute osteomyelitis, left femur: Secondary | ICD-10-CM | POA: Diagnosis not present

## 2022-07-31 DIAGNOSIS — M545 Low back pain, unspecified: Secondary | ICD-10-CM | POA: Diagnosis not present

## 2022-07-31 DIAGNOSIS — M25551 Pain in right hip: Secondary | ICD-10-CM | POA: Diagnosis not present

## 2022-07-31 DIAGNOSIS — M7061 Trochanteric bursitis, right hip: Secondary | ICD-10-CM | POA: Diagnosis not present

## 2022-08-07 DIAGNOSIS — M48061 Spinal stenosis, lumbar region without neurogenic claudication: Secondary | ICD-10-CM | POA: Diagnosis not present

## 2022-08-07 DIAGNOSIS — M5126 Other intervertebral disc displacement, lumbar region: Secondary | ICD-10-CM | POA: Diagnosis not present

## 2022-08-07 DIAGNOSIS — M8448XA Pathological fracture, other site, initial encounter for fracture: Secondary | ICD-10-CM | POA: Diagnosis not present

## 2022-08-19 ENCOUNTER — Other Ambulatory Visit: Payer: Self-pay | Admitting: Physician Assistant

## 2022-08-19 DIAGNOSIS — M5440 Lumbago with sciatica, unspecified side: Secondary | ICD-10-CM

## 2022-08-22 ENCOUNTER — Ambulatory Visit
Admission: RE | Admit: 2022-08-22 | Discharge: 2022-08-22 | Disposition: A | Discharge status: Home / Self Care | Payer: Medicare Other | Source: Ambulatory Visit | Attending: Physician Assistant | Admitting: Physician Assistant

## 2022-08-22 DIAGNOSIS — M5440 Lumbago with sciatica, unspecified side: Secondary | ICD-10-CM

## 2022-08-22 DIAGNOSIS — M8088XA Other osteoporosis with current pathological fracture, vertebra(e), initial encounter for fracture: Secondary | ICD-10-CM | POA: Diagnosis not present

## 2022-08-22 HISTORY — PX: IR RADIOLOGIST EVAL & MGMT: IMG5224

## 2022-08-22 NOTE — Progress Notes (Incomplete)
Reason for visit: Symptomatic sacral fractures   Care Team(s): Primary Care: Paulina Fusi, MD Orthopedic Spine: Garnette Gunner, MD   History of Present Illness:  Morgan Hunt is a 77 y.o. female comorbid, including PMHx significant for asthma, HTN and prior L hip fracture requiring revision(s). Pt is on maintenance doxycycline for chronic hip infection. She was seen by [facility] for "hip" pain and referred to VIR upon further investigation. She reports that in late February she went out to a restaurant with a friend and afterwards had sudden bilateral lower back pain. She denies any trauma. Her PCP saw her and started her on cyclobenzaprine and a steroid taper without relief. Given prior L3-S1 PLIF and L2 KP she was referred back to her Orthopedic Spine specialist (Dr Loralie Champagne) who ordered an MR L spine (08/07/22) revealing bilateral sacral insuffiency fractures. She has osteoporosis and is on Ca2+ and Vit D supplementation. She was previously on "bone shots" prior to her L hip fractures.  She reports her pain as constant, severe, bilateral and radiating to her proximal RLE. She is on PO opiates and gabapentin without relief. She was using a walker initially upon this injury but has progressed to a walker for increased stability. She reports a significant decrease in her QoL given the constant pain on sitting and even lying down.   Review of Systems: A 12-point ROS discussed, and pertinent positives are indicated in the HPI above.  All other systems are negative.   Past Medical History:  Diagnosis Date   Arthritis    Asthma    COPD (chronic obstructive pulmonary disease)    Hypertension     Past Surgical History:  Procedure Laterality Date   ABDOMINAL HYSTERECTOMY     IR RADIOLOGIST EVAL & MGMT  08/22/2022   REPLACEMENT TOTAL KNEE      Allergies: Dilaudid [hydromorphone hcl], Oxycodone-acetaminophen, Penicillins, Shrimp [shellfish allergy], and Sulfa  antibiotics  Medications: Prior to Admission medications   Medication Sig Start Date End Date Taking? Authorizing Provider  ALPRAZolam Prudy Feeler) 1 MG tablet Take 1 mg by mouth at bedtime as needed for anxiety.  01/30/20   [provider]  calcium-vitamin D (OSCAL WITH D) 500-200 MG-UNIT tablet Take 1 tablet by mouth daily with breakfast.    [provider]  DULoxetine (CYMBALTA) 60 MG capsule Take 60 mg by mouth daily. 01/23/20   [provider]  fluticasone (FLONASE) 50 MCG/ACT nasal spray Place 1 spray into both nostrils daily.  01/17/20   [provider]  lidocaine (LIDODERM) 5 % Place 1 patch onto the skin daily as needed (severe pain). Remove & Discard patch within 12 hours or as directed by MD Patient not taking: Reported on 02/04/2020 04/04/16   Tomasita Crumble, MD  lisinopril (ZESTRIL) 40 MG tablet Take 40 mg by mouth every evening.  01/10/20   [provider]  montelukast (SINGULAIR) 10 MG tablet Take 10 mg by mouth at bedtime.  01/10/20   [provider]  Multiple Vitamin (MULTIVITAMIN ADULT) TABS Take by mouth.    [provider]  Multiple Vitamins-Minerals (ZINC PO) Take 1 tablet by mouth daily.    [provider]  omeprazole (PRILOSEC) 40 MG capsule Take 40 mg by mouth daily. 01/30/20   [provider]  oxyCODONE (ROXICODONE) 5 MG immediate release tablet Take 1 tablet (5 mg total) by mouth 2 (two) times daily as needed for severe pain. Patient not taking: Reported on 02/04/2020 04/04/16  Tomasita Crumble, MD  SYMBICORT 160-4.5 MCG/ACT inhaler Inhale 2 puffs into the lungs daily.  12/22/19   [provider]  traZODone (DESYREL) 50 MG tablet Take 50 mg by mouth at bedtime. 12/20/19   [provider]     No family history on file.  Social History   Socioeconomic History   Marital status: Widowed    Spouse name: Not on file   Number of children: Not on file   Years of education: Not on file    Highest education level: Not on file  Occupational History   Not on file  Tobacco Use   Smoking status: Never   Smokeless tobacco: Never  Substance and Sexual Activity   Alcohol use: Not on file   Drug use: No   Sexual activity: Not on file  Other Topics Concern   Not on file  Social History Narrative   Not on file   Social Determinants of Health   Financial Resource Strain: Not on file  Food Insecurity: Not on file  Transportation Needs: Not on file  Physical Activity: Not on file  Stress: Not on file  Social Connections: Not on file    Review of Systems As above  Vital Signs: BP (!) 173/91   Pulse (!) 118   Temp 98 F (36.7 C) (Oral)   Wt 64.9 kg   SpO2 98% Comment: room air  BMI 25.33 kg/m   Physical Exam  General: Elderly, WN, NAD  CV: RRR on monitor Pulm: normal work of breathing on RA Abd: S, ND, NT MSK: ambulates w walker. Point tenderness and sacrum. Psych: Appropriate affect.    Imaging:  MR L spine, 08/07/22 Imaging independently reviewed, demonstrating bilateral sacral insuffiency fractures and edema on STIR    Labs:  CBC: No results for input(s): "WBC", "HGB", "HCT", "PLT" in the last 8760 hours.  COAGS: No results for input(s): "INR", "APTT" in the last 8760 hours.  BMP: No results for input(s): "NA", "K", "CL", "CO2", "GLUCOSE", "BUN", "CALCIUM", "CREATININE", "GFRNONAA", "GFRAA" in the last 8760 hours.  Invalid input(s): "CMP"   Assessment and Plan:  Ms.Morgan Hunt is a 77 y/o F comorbid, including PMHx significant for asthma, HTN and prior L hip fracture requiring revision(s). Pt is on maintenance doxycycline for chronic hip infection.  Acute to subacute to sacral insufficiency fractures at both levels by MRI (08/07/22) with edema.  Failed conservative management.  Ongoing, life-style limiting pain secondary to the fractures, refractory to conservative management. Pain on exam concordant with level of fracture, Failure of  conservative therapy and pain refractory to pain mediation, and Significant disability on the [employer] Disability Questionnaire with 21/24 positive symptoms, reflecting significant impact/impairment of (ADLs)   ICD-10-CM Codes that Support Medical Necessity ([email or web address]?[email or web address]) M80.08XA    Age-related osteoporosis with current pathological fracture M48.48XA    Fatigue fracture of vertebra, sacral and sacrococcygeal region   She is interested in pursuing a minimally-invasive option for the treatment of her sacral fractures at this time, and is curious about SACROPLASTY.  The procedure has been fully reviewed with the patient/patient's authorized representative. The risks, benefits and alternatives have been explained, and the patient/patient's authorized representative has consented to the procedure.   *MR L spine, 08/07/22 performed and reviewed. No additional imaging required. *Proceed to schedule based on mutual availability. Soonest available per Pt request given significant discomfort.   *Tentatively for 08/29/22 at [facility] *Same day procedure, no overnight admission. *Ancef for pre op Abx.   Thank  you for this interesting consult.  I greatly enjoyed meeting Morgan Hunt and look forward to participating in their care.  A copy of this report was sent to the requesting provider on this date.  Electronically Signed:  Roanna Banning, MD Vascular and Interventional Radiology Specialists [facility] Radiology   Pager. [phone] Clinic. [phone]  I spent a total of 60 Minutes in face to face in clinical consultation, greater than 50% of which was counseling/coordinating care for Mrs Morgan Hunt's sacral fractures.

## 2022-08-26 ENCOUNTER — Other Ambulatory Visit (HOSPITAL_COMMUNITY): Payer: Self-pay | Admitting: Interventional Radiology

## 2022-08-26 ENCOUNTER — Telehealth (HOSPITAL_COMMUNITY): Payer: Self-pay

## 2022-08-26 DIAGNOSIS — S72002K Fracture of unspecified part of neck of left femur, subsequent encounter for closed fracture with nonunion: Secondary | ICD-10-CM | POA: Diagnosis not present

## 2022-08-26 DIAGNOSIS — M8448XA Pathological fracture, other site, initial encounter for fracture: Secondary | ICD-10-CM

## 2022-08-26 DIAGNOSIS — S72302D Unspecified fracture of shaft of left femur, subsequent encounter for closed fracture with routine healing: Secondary | ICD-10-CM | POA: Diagnosis not present

## 2022-08-26 DIAGNOSIS — M810 Age-related osteoporosis without current pathological fracture: Secondary | ICD-10-CM | POA: Diagnosis not present

## 2022-08-26 DIAGNOSIS — S7292XK Unspecified fracture of left femur, subsequent encounter for closed fracture with nonunion: Secondary | ICD-10-CM | POA: Diagnosis not present

## 2022-08-26 DIAGNOSIS — M86252 Subacute osteomyelitis, left femur: Secondary | ICD-10-CM | POA: Diagnosis not present

## 2022-08-26 DIAGNOSIS — Z96642 Presence of left artificial hip joint: Secondary | ICD-10-CM | POA: Diagnosis not present

## 2022-08-26 NOTE — Telephone Encounter (Signed)
Called to schedule pt for Friday w/Dr. Milford Cage, no answer, left vm. AB

## 2022-08-28 ENCOUNTER — Other Ambulatory Visit: Payer: Self-pay | Admitting: Radiology

## 2022-08-28 DIAGNOSIS — M549 Dorsalgia, unspecified: Secondary | ICD-10-CM

## 2022-08-28 NOTE — H&P (Signed)
Chief Complaint: Sacral Compression Fracture. Patient presents for sacroplasty  Referring Physician(s): Dr. Garnette Gunner  Supervising Physician: Roanna Banning  Patient Status: [facility] - Out-pt  History of Present Illness: Morgan Hunt is a 77 y.o. female outpatient. History of asthma, hypertension, osteoporosis, hip fracture, s/p hemiarthroplasty with revisions. On doxycycline for chronic hip infection. Additional pertinent history includes L3,-S1 posterior lumbar interbody fusion and L2 kyphoplasty. Now with persistent hip pain limiting activities of daily living. Not improved with steroid injections and narcotic pain medication. MRI dated August 07, 2022 reads acute to subacute insufficiency fractures with edema. Patient was seen in the interventional radiology clinic on April 12th, 2024 by IR Attending Dr. Roanna Banning. At that time, the patient's clinical condition was discussed at length, including treatment options. After discussion, including the risks and benefits of the different treatment options. Patient has elected to proceed with a minimally, invasive sacroplasty.   Patient alert and laying in bed,calm. Endorses back pain that she says in baseline. Denies any fevers, headache, chest pain, SOB, cough, abdominal pain, nausea, vomiting or bleeding.   Return precautions and treatment recommendations and follow-up discussed with the patient  who is agreeable with the plan.  Past Medical History:  Diagnosis Date   Arthritis    Asthma    COPD (chronic obstructive pulmonary disease)    Hypertension     Past Surgical History:  Procedure Laterality Date   ABDOMINAL HYSTERECTOMY     IR RADIOLOGIST EVAL & MGMT  08/22/2022   REPLACEMENT TOTAL KNEE      Allergies: Dilaudid [hydromorphone hcl], Oxycodone-acetaminophen, Penicillins, Shrimp [shellfish allergy], and Sulfa antibiotics  Medications: Prior to Admission medications   Medication Sig Start Date End Date Taking? Authorizing  Provider  ALPRAZolam Prudy Feeler) 1 MG tablet Take 1 mg by mouth at bedtime as needed for anxiety.  01/30/20   [provider]  calcium-vitamin D (OSCAL WITH D) 500-200 MG-UNIT tablet Take 1 tablet by mouth daily with breakfast.    [provider]  doxycycline (VIBRAMYCIN) 100 MG capsule Take 100 mg by mouth daily. Pt will stay on medication 04/22/22   [provider]  DULoxetine (CYMBALTA) 60 MG capsule Take 60 mg by mouth daily. 01/23/20   [provider]  fluticasone (FLONASE) 50 MCG/ACT nasal spray Place 1 spray into both nostrils daily.  01/17/20   [provider]  lidocaine (LIDODERM) 5 % Place 1 patch onto the skin daily as needed (severe pain). Remove & Discard patch within 12 hours or as directed by MD Patient not taking: Reported on 02/04/2020 04/04/16   Tomasita Crumble, MD  lisinopril (ZESTRIL) 40 MG tablet Take 40 mg by mouth every evening.  01/10/20   [provider]  montelukast (SINGULAIR) 10 MG tablet Take 10 mg by mouth at bedtime.  01/10/20   [provider]  Multiple Vitamin (MULTIVITAMIN ADULT) TABS Take by mouth.    [provider]  Multiple Vitamins-Minerals (ZINC PO) Take 1 tablet by mouth daily.    [provider]  omeprazole (PRILOSEC) 40 MG capsule Take 40 mg by mouth daily. 01/30/20   [provider]  oxyCODONE (ROXICODONE) 5 MG immediate release tablet Take 1 tablet (5 mg total) by mouth 2 (two) times daily as needed for severe pain. Patient not taking: Reported on 02/04/2020 04/04/16   Tomasita Crumble, MD  [facility] 160-4.5 MCG/ACT inhaler Inhale 2 puffs into the lungs daily.  12/22/19   [provider]  traZODone (DESYREL) 50 MG tablet Take  50 mg by mouth at bedtime. 12/20/19   [provider]     No family history on file.  Social History   Socioeconomic History   Marital status: Widowed    Spouse name: Not on file   Number of children: Not on file   Years of education: Not  on file   Highest education level: Not on file  Occupational History   Not on file  Tobacco Use   Smoking status: Never   Smokeless tobacco: Never  Substance and Sexual Activity   Alcohol use: Not on file   Drug use: No   Sexual activity: Not on file  Other Topics Concern   Not on file  Social History Narrative   Not on file   Social Determinants of Health   Financial Resource Strain: Not on file  Food Insecurity: Not on file  Transportation Needs: Not on file  Physical Activity: Not on file  Stress: Not on file  Social Connections: Not on file     Review of Systems: A 12 point ROS discussed and pertinent positives are indicated in the HPI above.  All other systems are negative.  Review of Systems  Constitutional:  Negative for fatigue and fever.  HENT:  Negative for congestion.   Respiratory:  Negative for cough and shortness of breath.   Gastrointestinal:  Negative for abdominal pain, diarrhea, nausea and vomiting.  Musculoskeletal:  Positive for back pain.    Vital Signs: BP (!) 149/82   Pulse 94   Temp 98 F (36.7 C) (Oral)   Resp 16   Ht  (1.6 m)   Wt 140 lb (63.5 kg)   SpO2 96%   BMI 24.80 kg/m   Physical Exam Vitals and nursing note reviewed.  Constitutional:      Appearance: She is well-developed.  HENT:     Head: Normocephalic and atraumatic.  Eyes:     Conjunctiva/sclera: Conjunctivae normal.  Cardiovascular:     Rate and Rhythm: Normal rate and regular rhythm.     Heart sounds: Normal heart sounds.  Pulmonary:     Effort: Pulmonary effort is normal.     Breath sounds: Normal breath sounds.  Musculoskeletal:        General: Normal range of motion.     Cervical back: Normal range of motion.  Skin:    General: Skin is warm and dry.  Neurological:     General: No focal deficit present.     Mental Status: She is alert and oriented to person, place, and time.  Psychiatric:        Mood and Affect: Mood normal.        Thought Content:  Thought content normal.        Judgment: Judgment normal.     Imaging: IR Radiologist Eval & Mgmt  Result Date: 08/22/2022 EXAM: NEW PATIENT OFFICE VISIT CHIEF COMPLAINT: See below HISTORY OF PRESENT ILLNESS: See below REVIEW OF SYSTEMS: See below PHYSICAL EXAMINATION: See below ASSESSMENT AND PLAN: Please refer to completed note in the electronic medical record on [facility] Roanna Banning, MD Vascular and Interventional Radiology Specialists [facility] Radiology Electronically Signed   By: Roanna Banning M.D.   On: 08/22/2022 13:25    Labs:  CBC: Recent Labs    08/29/22 0833  WBC 6.2  HGB 12.3  HCT 37.4  PLT 294    COAGS: No results for input(s): "INR", "APTT" in the last 8760 hours.  BMP: No results for input(s): "  NA", "K", "CL", "CO2", "GLUCOSE", "BUN", "CALCIUM", "CREATININE", "GFRNONAA", "GFRAA" in the last 8760 hours.  Invalid input(s): "CMP"  LIVER FUNCTION TESTS: No results for input(s): "BILITOT", "AST", "ALT", "ALKPHOS", "PROT", "ALBUMIN" in the last 8760 hours.   Assessment and Plan:  77 year old female outpatient. History of asthma, hypertension, osteoporosis, hip fracture, s/p hemiarthroplasty with revisions. On doxycycline for chronic hip infection. Additional pertinent history includes L3,-S1 posterior lumbar interbody fusion and L2 kyphoplasty. Now with persistent hip pain limiting activities of daily living. Not improved with steroid injections and narcotic pain medication. MRI dated August 07, 2022 reads acute to subacute insufficiency fractures with edema. Patient was seen in the interventional radiology clinic on April 12th, 2024 by IR Attending Dr. Roanna Banning. At that time, the patient's clinical condition was discussed at length, including treatment options. After discussion, including the risks and benefits of the different treatment options. Patient has elected to proceed with a minimally, invasive sacroplasty.   All labs and medication's are within  acceptable parameters. allergies include Dilaudid, Vicodin, penicillin. Patient has been NPO since midnight.  Risks and benefits of  sacroplasty  were discussed with the patient including, but not limited to education regarding the natural healing process of compression fractures without intervention, bleeding, infection, cement migration which may cause spinal cord damage, paralysis, pulmonary embolism or even death. This interventional procedure involves the use of X-rays and because of the nature of the planned procedure, it is possible that we will have prolonged use of X-ray fluoroscopy. Potential radiation risks to you include (but are not limited to) the following: - A slightly elevated risk for cancer  several years later in life. This risk is typically less than 0.5% percent. This risk is low in comparison to the normal incidence of human cancer, which is 33% for women and 50% for men according to the American Cancer Society. - Radiation induced injury can include skin redness, resembling a rash, tissue breakdown / ulcers and hair loss (which can be temporary or permanent).  The likelihood of either of these occurring depends on the difficulty of the procedure and whether you are sensitive to radiation due to previous procedures, disease, or genetic conditions.  IF your procedure requires a prolonged use of radiation, you will be notified and given written instructions for further action.  It is your responsibility to monitor the irradiated area for the 2 weeks following the procedure and to notify your physician if you are concerned that you have suffered a radiation induced injury.   All of the patient's questions were answered, patient is agreeable to proceed.  Consent signed and in chart.    Thank you for this interesting consult.  I greatly enjoyed meeting Morgan Hunt and look forward to participating in their care.  A copy of this report was sent to the requesting provider on this  date.  Electronically Signed: Alene Mires, NP 08/29/2022, 8:52 AM   I spent a total of    15 Minutes in face to face in clinical consultation, greater than 50% of which was counseling/coordinating care for sacroplasty

## 2022-08-29 ENCOUNTER — Other Ambulatory Visit (HOSPITAL_COMMUNITY): Payer: Self-pay

## 2022-08-29 ENCOUNTER — Ambulatory Visit (HOSPITAL_COMMUNITY)
Admission: RE | Admit: 2022-08-29 | Discharge: 2022-08-29 | Disposition: A | Discharge status: Home / Self Care | Payer: Medicare Other | Source: Ambulatory Visit | Attending: Interventional Radiology | Admitting: Interventional Radiology

## 2022-08-29 ENCOUNTER — Other Ambulatory Visit: Payer: Self-pay | Admitting: Radiology

## 2022-08-29 ENCOUNTER — Other Ambulatory Visit: Payer: Self-pay

## 2022-08-29 DIAGNOSIS — I10 Essential (primary) hypertension: Secondary | ICD-10-CM | POA: Diagnosis not present

## 2022-08-29 DIAGNOSIS — J45909 Unspecified asthma, uncomplicated: Secondary | ICD-10-CM | POA: Insufficient documentation

## 2022-08-29 DIAGNOSIS — M8008XA Age-related osteoporosis with current pathological fracture, vertebra(e), initial encounter for fracture: Secondary | ICD-10-CM | POA: Insufficient documentation

## 2022-08-29 DIAGNOSIS — M8448XA Pathological fracture, other site, initial encounter for fracture: Secondary | ICD-10-CM | POA: Diagnosis present

## 2022-08-29 DIAGNOSIS — M549 Dorsalgia, unspecified: Secondary | ICD-10-CM

## 2022-08-29 HISTORY — PX: IR SACROPLASTY BILATERAL: IMG5561

## 2022-08-29 LAB — PROTIME-INR
INR: 1 (ref 0.8–1.2)
Prothrombin Time: 12.7 seconds (ref 11.4–15.2)

## 2022-08-29 LAB — CBC
HCT: 37.4 % (ref 36.0–46.0)
Hemoglobin: 12.3 g/dL (ref 12.0–15.0)
MCH: 32.4 pg (ref 26.0–34.0)
MCHC: 32.9 g/dL (ref 30.0–36.0)
MCV: 98.4 fL (ref 80.0–100.0)
Platelets: 294 10*3/uL (ref 150–400)
RBC: 3.8 MIL/uL — ABNORMAL LOW (ref 3.87–5.11)
RDW: 13.6 % (ref 11.5–15.5)
WBC: 6.2 10*3/uL (ref 4.0–10.5)
nRBC: 0 % (ref 0.0–0.2)

## 2022-08-29 LAB — BASIC METABOLIC PANEL
Anion gap: 13 (ref 5–15)
BUN: 16 mg/dL (ref 8–23)
CO2: 23 mmol/L (ref 22–32)
Calcium: 9.8 mg/dL (ref 8.9–10.3)
Chloride: 96 mmol/L — ABNORMAL LOW (ref 98–111)
Creatinine, Ser: 0.91 mg/dL (ref 0.44–1.00)
GFR, Estimated: >60 mL/min (ref >60)
Glucose, Bld: 96 mg/dL (ref 70–99)
Potassium: 4.2 mmol/L (ref 3.5–5.1)
Sodium: 132 mmol/L — ABNORMAL LOW (ref 135–145)

## 2022-08-29 MED ORDER — TRAMADOL HCL 50 MG PO TABS
50.0000 mg | ORAL_TABLET | Freq: Four times a day (QID) | ORAL | 0 refills | Status: AC | PRN
  Filled 2022-08-29 (×2): qty 20, 5d supply, fill #0

## 2022-08-29 MED ORDER — BUPIVACAINE HCL (PF) 0.25 % IJ SOLN
INTRAMUSCULAR | Status: AC
  Filled 2022-08-29: qty 30

## 2022-08-29 MED ORDER — METHOCARBAMOL 750 MG PO TABS
750.0000 mg | ORAL_TABLET | Freq: Three times a day (TID) | ORAL | 0 refills | Status: AC | PRN
  Filled 2022-08-29 (×2): qty 15, 5d supply, fill #0

## 2022-08-29 MED ORDER — MIDAZOLAM HCL 2 MG/2ML IJ SOLN
INTRAMUSCULAR | Status: AC | PRN
  Administered 2022-08-29: 1 mg via INTRAVENOUS
  Administered 2022-08-29 (×4): .5 mg via INTRAVENOUS

## 2022-08-29 MED ORDER — SODIUM CHLORIDE 0.9 % IV SOLN: INTRAVENOUS | Status: DC

## 2022-08-29 MED ORDER — FENTANYL CITRATE (PF) 100 MCG/2ML IJ SOLN
INTRAMUSCULAR | Status: AC
  Filled 2022-08-29: qty 4

## 2022-08-29 MED ORDER — KETOROLAC TROMETHAMINE 30 MG/ML IJ SOLN
INTRAMUSCULAR | Status: AC
  Filled 2022-08-29: qty 1

## 2022-08-29 MED ORDER — VANCOMYCIN HCL IN DEXTROSE 1-5 GM/200ML-% IV SOLN
1000.0000 mg | INTRAVENOUS | Status: AC
  Administered 2022-08-29: 1000 mg via INTRAVENOUS

## 2022-08-29 MED ORDER — VANCOMYCIN HCL IN DEXTROSE 1-5 GM/200ML-% IV SOLN
INTRAVENOUS | Status: AC
  Filled 2022-08-29: qty 200

## 2022-08-29 MED ORDER — GABAPENTIN 100 MG PO CAPS
100.0000 mg | ORAL_CAPSULE | Freq: Three times a day (TID) | ORAL | 0 refills | Status: AC
  Filled 2022-08-29: qty 15, 5d supply, fill #0

## 2022-08-29 MED ORDER — MIDAZOLAM HCL 2 MG/2ML IJ SOLN
INTRAMUSCULAR | Status: AC
  Filled 2022-08-29: qty 4

## 2022-08-29 MED ORDER — TRAMADOL HCL 50 MG PO TABS
50.0000 mg | ORAL_TABLET | Freq: Four times a day (QID) | ORAL | Status: DC
  Administered 2022-08-29: 50 mg via ORAL
  Filled 2022-08-29: qty 1

## 2022-08-29 MED ORDER — FENTANYL CITRATE (PF) 100 MCG/2ML IJ SOLN
INTRAMUSCULAR | Status: AC | PRN
  Administered 2022-08-29 (×6): 25 ug via INTRAVENOUS

## 2022-08-29 MED ORDER — MORPHINE SULFATE (PF) 2 MG/ML IV SOLN: 2.0000 mg | INTRAVENOUS | Status: DC | PRN

## 2022-08-29 MED ORDER — LIDOCAINE HCL (PF) 1 % IJ SOLN
INTRAMUSCULAR | Status: AC
  Filled 2022-08-29: qty 30

## 2022-08-29 MED ORDER — KETOROLAC TROMETHAMINE 15 MG/ML IJ SOLN
INTRAMUSCULAR | Status: AC | PRN
  Administered 2022-08-29 (×2): 15 mg via INTRAVENOUS

## 2022-08-29 NOTE — Procedures (Signed)
Vascular and Interventional Radiology Procedure Note  Patient: Morgan Hunt DOB: Jan 16, 1946 Medical Record Number: [id number] Note Date/Time: 08/29/22 9:23 AM   Performing Physician: Roanna Banning, MD Assistant(s): None  Diagnosis: Symptomatic sacral insufficiency fractures.  Procedure: SACROPLASTY  Anesthesia: Conscious Sedation Complications: None Estimated Blood Loss: Minimal Specimens: Sent for None  Findings:  Successful Fluoroscopy-guided bilateral sacroplasty via a lateral posterior oblique approach. A total of 5 mL PMMA was used. Hemostasis of the tract was achieved using Manual Pressure.  Plan: Bed rest for 2 hours.  See detailed procedure note with images in PACS. The patient tolerated the procedure well without incident or complication and was returned to Recovery in stable condition.    Roanna Banning, MD Vascular and Interventional Radiology Specialists [facility] Radiology   Pager. [phone] Clinic. [phone]

## 2022-09-01 ENCOUNTER — Telehealth (HOSPITAL_COMMUNITY): Payer: Self-pay | Admitting: Interventional Radiology

## 2022-09-01 NOTE — Progress Notes (Signed)
Vascular and Interventional Radiology  Phone Note  Patient: Morgan Hunt DOB: 1945/12/19 Medical Record Number: [id number] Note Date/Time: 09/01/22 5:28 PM   Diagnosis: Osteoporosis. Sacral insufficiency fractures.   I identified myself to the patient and conveyed my credentials to Morgan Hunt For medical emergencies, Pt was advised to call 911 or go to the nearest emergency room.   Assessment  Plan: 77 y.o. year old female POD 3 s/p sacroplasty. VIR reached out in courtesy follow-up.  She was joined in the call by her adult daughter and POA, Morgan Hunt. Pt reports that they are doing well. Some access site discomfort but pain is well controlled with Rxs.  Has already transitioned from walker back to cane given reported increased stability in her mobility. No concern at this time.  Follow up Pt to follow up with me in Clinic within 1 month post op.   As part of this Telephone encounter, no in-person exam was conducted.  The patient was physically located in [place] or a state in which I am permitted to provide care. The encounter was reasonable and appropriate under the circumstances given the patient's presentation at the time.    Roanna Banning, MD Vascular and Interventional Radiology Specialists [facility] Radiology   Pager. [phone] Clinic. [phone]

## 2022-09-02 ENCOUNTER — Other Ambulatory Visit: Payer: Self-pay | Admitting: Interventional Radiology

## 2022-09-02 DIAGNOSIS — M5440 Lumbago with sciatica, unspecified side: Secondary | ICD-10-CM

## 2022-09-09 DIAGNOSIS — X58XXXA Exposure to other specified factors, initial encounter: Secondary | ICD-10-CM | POA: Diagnosis not present

## 2022-09-09 DIAGNOSIS — M79606 Pain in leg, unspecified: Secondary | ICD-10-CM | POA: Diagnosis not present

## 2022-09-09 DIAGNOSIS — Z880 Allergy status to penicillin: Secondary | ICD-10-CM | POA: Diagnosis not present

## 2022-09-09 DIAGNOSIS — Z885 Allergy status to narcotic agent status: Secondary | ICD-10-CM | POA: Diagnosis not present

## 2022-09-09 DIAGNOSIS — M549 Dorsalgia, unspecified: Secondary | ICD-10-CM | POA: Diagnosis not present

## 2022-09-09 DIAGNOSIS — M8580 Other specified disorders of bone density and structure, unspecified site: Secondary | ICD-10-CM | POA: Diagnosis not present

## 2022-09-09 DIAGNOSIS — Z7401 Bed confinement status: Secondary | ICD-10-CM | POA: Diagnosis not present

## 2022-09-09 DIAGNOSIS — S3210XD Unspecified fracture of sacrum, subsequent encounter for fracture with routine healing: Secondary | ICD-10-CM | POA: Diagnosis not present

## 2022-09-09 DIAGNOSIS — S22080A Wedge compression fracture of T11-T12 vertebra, initial encounter for closed fracture: Secondary | ICD-10-CM | POA: Diagnosis not present

## 2022-09-09 DIAGNOSIS — Z791 Long term (current) use of non-steroidal anti-inflammatories (NSAID): Secondary | ICD-10-CM | POA: Diagnosis not present

## 2022-09-09 DIAGNOSIS — Z79899 Other long term (current) drug therapy: Secondary | ICD-10-CM | POA: Diagnosis not present

## 2022-09-09 DIAGNOSIS — S32020A Wedge compression fracture of second lumbar vertebra, initial encounter for closed fracture: Secondary | ICD-10-CM | POA: Diagnosis not present

## 2022-09-09 DIAGNOSIS — S32591A Other specified fracture of right pubis, initial encounter for closed fracture: Secondary | ICD-10-CM | POA: Diagnosis not present

## 2022-09-09 DIAGNOSIS — S32020D Wedge compression fracture of second lumbar vertebra, subsequent encounter for fracture with routine healing: Secondary | ICD-10-CM | POA: Diagnosis not present

## 2022-09-09 DIAGNOSIS — Z96642 Presence of left artificial hip joint: Secondary | ICD-10-CM | POA: Diagnosis not present

## 2022-09-09 DIAGNOSIS — M25551 Pain in right hip: Secondary | ICD-10-CM | POA: Diagnosis not present

## 2022-09-09 DIAGNOSIS — Z4789 Encounter for other orthopedic aftercare: Secondary | ICD-10-CM | POA: Diagnosis not present

## 2022-09-09 DIAGNOSIS — Z91013 Allergy to seafood: Secondary | ICD-10-CM | POA: Diagnosis not present

## 2022-09-09 DIAGNOSIS — Z792 Long term (current) use of antibiotics: Secondary | ICD-10-CM | POA: Diagnosis not present

## 2022-09-09 DIAGNOSIS — K5909 Other constipation: Secondary | ICD-10-CM | POA: Diagnosis not present

## 2022-09-09 DIAGNOSIS — S32599D Other specified fracture of unspecified pubis, subsequent encounter for fracture with routine healing: Secondary | ICD-10-CM | POA: Diagnosis not present

## 2022-09-09 DIAGNOSIS — I10 Essential (primary) hypertension: Secondary | ICD-10-CM | POA: Diagnosis not present

## 2022-09-09 DIAGNOSIS — R531 Weakness: Secondary | ICD-10-CM | POA: Diagnosis not present

## 2022-09-09 DIAGNOSIS — S32402A Unspecified fracture of left acetabulum, initial encounter for closed fracture: Secondary | ICD-10-CM | POA: Diagnosis not present

## 2022-09-09 DIAGNOSIS — D649 Anemia, unspecified: Secondary | ICD-10-CM | POA: Diagnosis not present

## 2022-09-09 DIAGNOSIS — F32A Depression, unspecified: Secondary | ICD-10-CM | POA: Diagnosis not present

## 2022-09-09 DIAGNOSIS — Z743 Need for continuous supervision: Secondary | ICD-10-CM | POA: Diagnosis not present

## 2022-09-09 DIAGNOSIS — J45909 Unspecified asthma, uncomplicated: Secondary | ICD-10-CM | POA: Diagnosis not present

## 2022-09-09 DIAGNOSIS — Z8619 Personal history of other infectious and parasitic diseases: Secondary | ICD-10-CM | POA: Diagnosis not present

## 2022-09-09 DIAGNOSIS — M1990 Unspecified osteoarthritis, unspecified site: Secondary | ICD-10-CM | POA: Diagnosis not present

## 2022-09-09 DIAGNOSIS — Z87442 Personal history of urinary calculi: Secondary | ICD-10-CM | POA: Diagnosis not present

## 2022-09-09 DIAGNOSIS — Z79891 Long term (current) use of opiate analgesic: Secondary | ICD-10-CM | POA: Diagnosis not present

## 2022-09-09 DIAGNOSIS — Z882 Allergy status to sulfonamides status: Secondary | ICD-10-CM | POA: Diagnosis not present

## 2022-09-09 DIAGNOSIS — S3210XA Unspecified fracture of sacrum, initial encounter for closed fracture: Secondary | ICD-10-CM | POA: Diagnosis not present

## 2022-09-12 DIAGNOSIS — R531 Weakness: Secondary | ICD-10-CM | POA: Diagnosis not present

## 2022-09-12 DIAGNOSIS — M549 Dorsalgia, unspecified: Secondary | ICD-10-CM | POA: Diagnosis not present

## 2022-09-12 DIAGNOSIS — Z7401 Bed confinement status: Secondary | ICD-10-CM | POA: Diagnosis not present

## 2022-09-12 DIAGNOSIS — R5381 Other malaise: Secondary | ICD-10-CM | POA: Diagnosis not present

## 2022-09-12 DIAGNOSIS — Z8619 Personal history of other infectious and parasitic diseases: Secondary | ICD-10-CM | POA: Diagnosis not present

## 2022-09-12 DIAGNOSIS — S32020A Wedge compression fracture of second lumbar vertebra, initial encounter for closed fracture: Secondary | ICD-10-CM | POA: Diagnosis not present

## 2022-09-12 DIAGNOSIS — S3210XD Unspecified fracture of sacrum, subsequent encounter for fracture with routine healing: Secondary | ICD-10-CM | POA: Diagnosis not present

## 2022-09-12 DIAGNOSIS — S32599D Other specified fracture of unspecified pubis, subsequent encounter for fracture with routine healing: Secondary | ICD-10-CM | POA: Diagnosis not present

## 2022-09-12 DIAGNOSIS — M8448XA Pathological fracture, other site, initial encounter for fracture: Secondary | ICD-10-CM | POA: Diagnosis not present

## 2022-09-12 DIAGNOSIS — S32599A Other specified fracture of unspecified pubis, initial encounter for closed fracture: Secondary | ICD-10-CM | POA: Diagnosis not present

## 2022-09-12 DIAGNOSIS — M8580 Other specified disorders of bone density and structure, unspecified site: Secondary | ICD-10-CM | POA: Diagnosis not present

## 2022-09-12 DIAGNOSIS — I10 Essential (primary) hypertension: Secondary | ICD-10-CM | POA: Diagnosis not present

## 2022-09-12 DIAGNOSIS — S32591A Other specified fracture of right pubis, initial encounter for closed fracture: Secondary | ICD-10-CM | POA: Diagnosis not present

## 2022-09-12 DIAGNOSIS — S3210XA Unspecified fracture of sacrum, initial encounter for closed fracture: Secondary | ICD-10-CM | POA: Diagnosis not present

## 2022-09-12 DIAGNOSIS — S32020D Wedge compression fracture of second lumbar vertebra, subsequent encounter for fracture with routine healing: Secondary | ICD-10-CM | POA: Diagnosis not present

## 2022-09-12 DIAGNOSIS — Z743 Need for continuous supervision: Secondary | ICD-10-CM | POA: Diagnosis not present

## 2022-09-12 DIAGNOSIS — Z4789 Encounter for other orthopedic aftercare: Secondary | ICD-10-CM | POA: Diagnosis not present

## 2022-09-12 DIAGNOSIS — J45909 Unspecified asthma, uncomplicated: Secondary | ICD-10-CM | POA: Diagnosis not present

## 2022-09-12 DIAGNOSIS — J449 Chronic obstructive pulmonary disease, unspecified: Secondary | ICD-10-CM | POA: Diagnosis not present

## 2022-09-15 DIAGNOSIS — J45909 Unspecified asthma, uncomplicated: Secondary | ICD-10-CM | POA: Diagnosis not present

## 2022-09-15 DIAGNOSIS — I10 Essential (primary) hypertension: Secondary | ICD-10-CM | POA: Diagnosis not present

## 2022-09-15 DIAGNOSIS — M8448XA Pathological fracture, other site, initial encounter for fracture: Secondary | ICD-10-CM | POA: Diagnosis not present

## 2022-09-15 DIAGNOSIS — S32599A Other specified fracture of unspecified pubis, initial encounter for closed fracture: Secondary | ICD-10-CM | POA: Diagnosis not present

## 2022-09-15 DIAGNOSIS — J449 Chronic obstructive pulmonary disease, unspecified: Secondary | ICD-10-CM | POA: Diagnosis not present

## 2022-09-17 DIAGNOSIS — J45909 Unspecified asthma, uncomplicated: Secondary | ICD-10-CM | POA: Diagnosis not present

## 2022-09-17 DIAGNOSIS — I10 Essential (primary) hypertension: Secondary | ICD-10-CM | POA: Diagnosis not present

## 2022-09-17 DIAGNOSIS — M8448XA Pathological fracture, other site, initial encounter for fracture: Secondary | ICD-10-CM | POA: Diagnosis not present

## 2022-09-17 DIAGNOSIS — S32599A Other specified fracture of unspecified pubis, initial encounter for closed fracture: Secondary | ICD-10-CM | POA: Diagnosis not present

## 2022-09-17 DIAGNOSIS — J449 Chronic obstructive pulmonary disease, unspecified: Secondary | ICD-10-CM | POA: Diagnosis not present

## 2022-09-22 DIAGNOSIS — I10 Essential (primary) hypertension: Secondary | ICD-10-CM | POA: Diagnosis not present

## 2022-09-22 DIAGNOSIS — J449 Chronic obstructive pulmonary disease, unspecified: Secondary | ICD-10-CM | POA: Diagnosis not present

## 2022-09-23 DIAGNOSIS — M8448XA Pathological fracture, other site, initial encounter for fracture: Secondary | ICD-10-CM | POA: Diagnosis not present

## 2022-09-23 DIAGNOSIS — S32599A Other specified fracture of unspecified pubis, initial encounter for closed fracture: Secondary | ICD-10-CM | POA: Diagnosis not present

## 2022-09-23 DIAGNOSIS — I10 Essential (primary) hypertension: Secondary | ICD-10-CM | POA: Diagnosis not present

## 2022-09-23 DIAGNOSIS — R5381 Other malaise: Secondary | ICD-10-CM | POA: Diagnosis not present

## 2022-09-23 DIAGNOSIS — J45909 Unspecified asthma, uncomplicated: Secondary | ICD-10-CM | POA: Diagnosis not present

## 2022-09-23 DIAGNOSIS — J449 Chronic obstructive pulmonary disease, unspecified: Secondary | ICD-10-CM | POA: Diagnosis not present

## 2022-09-26 ENCOUNTER — Ambulatory Visit
Admission: RE | Admit: 2022-09-26 | Discharge: 2022-09-26 | Disposition: A | Discharge status: Home / Self Care | Payer: Medicare Other | Source: Ambulatory Visit | Attending: Interventional Radiology | Admitting: Interventional Radiology

## 2022-09-26 DIAGNOSIS — M5440 Lumbago with sciatica, unspecified side: Secondary | ICD-10-CM

## 2022-09-26 HISTORY — PX: IR RADIOLOGIST EVAL & MGMT: IMG5224

## 2022-09-26 NOTE — Progress Notes (Signed)
Referring Physician(s): Katleen Carraway  Supervising Physician: {Supervising Physician:[id number]}  Chief Complaint: The patient is seen in follow up today s/p ***  History of present illness:  ***  Past Medical History:  Diagnosis Date   Arthritis    Asthma    COPD (chronic obstructive pulmonary disease) (HCC)    Hypertension     Past Surgical History:  Procedure Laterality Date   ABDOMINAL HYSTERECTOMY     IR RADIOLOGIST EVAL & MGMT  08/22/2022   IR SACROPLASTY BILATERAL  08/29/2022   REPLACEMENT TOTAL KNEE      Allergies: Dilaudid [hydromorphone hcl], Oxycodone-acetaminophen, Penicillins, Shrimp [shellfish allergy], and Sulfa antibiotics  Medications: Prior to Admission medications   Medication Sig Start Date End Date Taking? Authorizing Provider  ALPRAZolam Prudy Feeler) 1 MG tablet Take 1 mg by mouth at bedtime as needed for anxiety.  01/30/20   [provider]  calcium-vitamin D (OSCAL WITH D) 500-200 MG-UNIT tablet Take 1 tablet by mouth daily with breakfast.    [provider]  doxycycline (VIBRAMYCIN) 100 MG capsule Take 100 mg by mouth daily. Pt will stay on medication 04/22/22   [provider]  DULoxetine (CYMBALTA) 60 MG capsule Take 60 mg by mouth daily. 01/23/20   [provider]  fluticasone (FLONASE) 50 MCG/ACT nasal spray Place 1 spray into both nostrils daily.  01/17/20   [provider]  gabapentin (NEURONTIN) 100 MG capsule Take 1 capsule (100 mg total) by mouth 3 (three) times daily for 5 days. 08/29/22 09/03/22  Alene Mires, NP  lidocaine (LIDODERM) 5 % Place 1 patch onto the skin daily as needed (severe pain). Remove & Discard patch within 12 hours or as directed by MD Patient not taking: Reported on 02/04/2020 04/04/16   Tomasita Crumble, MD  lisinopril (ZESTRIL) 40 MG tablet Take 40 mg by mouth every evening.  01/10/20   [provider]  montelukast (SINGULAIR) 10 MG tablet Take 10 mg by mouth at bedtime.   01/10/20   [provider]  Multiple Vitamin (MULTIVITAMIN ADULT) TABS Take by mouth.    [provider]  Multiple Vitamins-Minerals (ZINC PO) Take 1 tablet by mouth daily.    [provider]  omeprazole (PRILOSEC) 40 MG capsule Take 40 mg by mouth daily. 01/30/20   [provider]  oxyCODONE (ROXICODONE) 5 MG immediate release tablet Take 1 tablet (5 mg total) by mouth 2 (two) times daily as needed for severe pain. Patient not taking: Reported on 02/04/2020 04/04/16   Tomasita Crumble, MD  [facility] 160-4.5 MCG/ACT inhaler Inhale 2 puffs into the lungs daily.  12/22/19   [provider]  traMADol (ULTRAM) 50 MG tablet Take 1 tablet (50 mg total) by mouth every 6 (six) hours as needed for up to 20 doses. 08/29/22   Alene Mires, NP  traZODone (DESYREL) 50 MG tablet Take 50 mg by mouth at bedtime. 12/20/19   [provider]     No family history on file.  Social History   Socioeconomic History   Marital status: Widowed    Spouse name: Not on file   Number of children: Not on file   Years of education: Not on file   Highest education level: Not on file  Occupational History   Not on file  Tobacco Use   Smoking status: Never   Smokeless tobacco: Never  Substance and Sexual Activity   Alcohol use: Not on file   Drug use: No   Sexual activity: Not on  file  Other Topics Concern   Not on file  Social History Narrative   Not on file   Social Determinants of Health   Financial Resource Strain: Not on file  Food Insecurity: Not on file  Transportation Needs: Not on file  Physical Activity: Not on file  Stress: Not on file  Social Connections: Not on file     Vital Signs: There were no vitals taken for this visit.  Physical Exam  Imaging: No results found.    Labs:  CBC: Recent Labs    08/29/22 0833  WBC 6.2  HGB 12.3  HCT 37.4  PLT 294    COAGS: Recent Labs    08/29/22 0833  INR 1.0    BMP: Recent  Labs    08/29/22 0833  NA 132*  K 4.2  CL 96*  CO2 23  GLUCOSE 96  BUN 16  CALCIUM 9.8  CREATININE 0.91  GFRNONAA >60    LIVER FUNCTION TESTS: No results for input(s): "BILITOT", "AST", "ALT", "ALKPHOS", "PROT", "ALBUMIN" in the last 8760 hours.  Assessment and Plan:  ***  Electronically Signed: Roanna Banning 09/26/2022, 10:03 AM   I spent a total of {Established Out-Pt:[id number]} in face to face in clinical consultation, greater than 50% of which was counseling/coordinating care for ***

## 2022-10-30 DIAGNOSIS — M810 Age-related osteoporosis without current pathological fracture: Secondary | ICD-10-CM | POA: Diagnosis not present

## 2022-10-30 DIAGNOSIS — Z780 Asymptomatic menopausal state: Secondary | ICD-10-CM | POA: Diagnosis not present

## 2022-10-30 DIAGNOSIS — S7292XK Unspecified fracture of left femur, subsequent encounter for closed fracture with nonunion: Secondary | ICD-10-CM | POA: Diagnosis not present

## 2022-10-30 DIAGNOSIS — S72302D Unspecified fracture of shaft of left femur, subsequent encounter for closed fracture with routine healing: Secondary | ICD-10-CM | POA: Diagnosis not present

## 2022-11-06 DIAGNOSIS — T85848D Pain due to other internal prosthetic devices, implants and grafts, subsequent encounter: Secondary | ICD-10-CM | POA: Diagnosis not present

## 2022-11-06 DIAGNOSIS — S7292XK Unspecified fracture of left femur, subsequent encounter for closed fracture with nonunion: Secondary | ICD-10-CM | POA: Diagnosis not present

## 2022-11-11 DIAGNOSIS — S72351K Displaced comminuted fracture of shaft of right femur, subsequent encounter for closed fracture with nonunion: Secondary | ICD-10-CM | POA: Diagnosis not present

## 2022-11-11 DIAGNOSIS — Z471 Aftercare following joint replacement surgery: Secondary | ICD-10-CM | POA: Diagnosis not present

## 2022-11-11 DIAGNOSIS — Z96652 Presence of left artificial knee joint: Secondary | ICD-10-CM | POA: Diagnosis not present

## 2022-11-17 DIAGNOSIS — S79929A Unspecified injury of unspecified thigh, initial encounter: Secondary | ICD-10-CM | POA: Diagnosis not present

## 2022-11-17 DIAGNOSIS — S72352K Displaced comminuted fracture of shaft of left femur, subsequent encounter for closed fracture with nonunion: Secondary | ICD-10-CM | POA: Diagnosis not present

## 2022-11-17 DIAGNOSIS — M9702XA Periprosthetic fracture around internal prosthetic left hip joint, initial encounter: Secondary | ICD-10-CM | POA: Diagnosis not present

## 2022-11-17 DIAGNOSIS — Z8619 Personal history of other infectious and parasitic diseases: Secondary | ICD-10-CM | POA: Diagnosis not present

## 2022-11-17 DIAGNOSIS — Z7951 Long term (current) use of inhaled steroids: Secondary | ICD-10-CM | POA: Diagnosis not present

## 2022-11-17 DIAGNOSIS — R278 Other lack of coordination: Secondary | ICD-10-CM | POA: Diagnosis not present

## 2022-11-17 DIAGNOSIS — K567 Ileus, unspecified: Secondary | ICD-10-CM | POA: Diagnosis not present

## 2022-11-17 DIAGNOSIS — M25552 Pain in left hip: Secondary | ICD-10-CM | POA: Diagnosis not present

## 2022-11-17 DIAGNOSIS — G4700 Insomnia, unspecified: Secondary | ICD-10-CM | POA: Diagnosis not present

## 2022-11-17 DIAGNOSIS — I10 Essential (primary) hypertension: Secondary | ICD-10-CM | POA: Diagnosis not present

## 2022-11-17 DIAGNOSIS — G8929 Other chronic pain: Secondary | ICD-10-CM | POA: Diagnosis not present

## 2022-11-17 DIAGNOSIS — M4854XA Collapsed vertebra, not elsewhere classified, thoracic region, initial encounter for fracture: Secondary | ICD-10-CM | POA: Diagnosis not present

## 2022-11-17 DIAGNOSIS — S72302D Unspecified fracture of shaft of left femur, subsequent encounter for closed fracture with routine healing: Secondary | ICD-10-CM | POA: Diagnosis not present

## 2022-11-17 DIAGNOSIS — Z471 Aftercare following joint replacement surgery: Secondary | ICD-10-CM | POA: Diagnosis not present

## 2022-11-17 DIAGNOSIS — R0689 Other abnormalities of breathing: Secondary | ICD-10-CM | POA: Diagnosis not present

## 2022-11-17 DIAGNOSIS — S32020D Wedge compression fracture of second lumbar vertebra, subsequent encounter for fracture with routine healing: Secondary | ICD-10-CM | POA: Diagnosis not present

## 2022-11-17 DIAGNOSIS — S32591D Other specified fracture of right pubis, subsequent encounter for fracture with routine healing: Secondary | ICD-10-CM | POA: Diagnosis not present

## 2022-11-17 DIAGNOSIS — Z741 Need for assistance with personal care: Secondary | ICD-10-CM | POA: Diagnosis not present

## 2022-11-17 DIAGNOSIS — R531 Weakness: Secondary | ICD-10-CM | POA: Diagnosis not present

## 2022-11-17 DIAGNOSIS — S7292XA Unspecified fracture of left femur, initial encounter for closed fracture: Secondary | ICD-10-CM | POA: Diagnosis not present

## 2022-11-17 DIAGNOSIS — Z743 Need for continuous supervision: Secondary | ICD-10-CM | POA: Diagnosis not present

## 2022-11-17 DIAGNOSIS — R140 Abdominal distension (gaseous): Secondary | ICD-10-CM | POA: Diagnosis not present

## 2022-11-17 DIAGNOSIS — J449 Chronic obstructive pulmonary disease, unspecified: Secondary | ICD-10-CM | POA: Diagnosis not present

## 2022-11-17 DIAGNOSIS — J4489 Other specified chronic obstructive pulmonary disease: Secondary | ICD-10-CM | POA: Diagnosis not present

## 2022-11-17 DIAGNOSIS — R9431 Abnormal electrocardiogram [ECG] [EKG]: Secondary | ICD-10-CM | POA: Diagnosis not present

## 2022-11-17 DIAGNOSIS — Z79899 Other long term (current) drug therapy: Secondary | ICD-10-CM | POA: Diagnosis not present

## 2022-11-17 DIAGNOSIS — R2689 Other abnormalities of gait and mobility: Secondary | ICD-10-CM | POA: Diagnosis not present

## 2022-11-17 DIAGNOSIS — G8918 Other acute postprocedural pain: Secondary | ICD-10-CM | POA: Diagnosis not present

## 2022-11-17 DIAGNOSIS — L304 Erythema intertrigo: Secondary | ICD-10-CM | POA: Diagnosis not present

## 2022-11-17 DIAGNOSIS — E222 Syndrome of inappropriate secretion of antidiuretic hormone: Secondary | ICD-10-CM | POA: Diagnosis not present

## 2022-11-17 DIAGNOSIS — T8469XA Infection and inflammatory reaction due to internal fixation device of other site, initial encounter: Secondary | ICD-10-CM | POA: Diagnosis not present

## 2022-11-17 DIAGNOSIS — M9712XA Periprosthetic fracture around internal prosthetic left knee joint, initial encounter: Secondary | ICD-10-CM | POA: Diagnosis not present

## 2022-11-17 DIAGNOSIS — Z96652 Presence of left artificial knee joint: Secondary | ICD-10-CM | POA: Diagnosis not present

## 2022-11-17 DIAGNOSIS — S3210XD Unspecified fracture of sacrum, subsequent encounter for fracture with routine healing: Secondary | ICD-10-CM | POA: Diagnosis not present

## 2022-11-17 DIAGNOSIS — S7292XK Unspecified fracture of left femur, subsequent encounter for closed fracture with nonunion: Secondary | ICD-10-CM | POA: Diagnosis not present

## 2022-11-17 DIAGNOSIS — R195 Other fecal abnormalities: Secondary | ICD-10-CM | POA: Diagnosis not present

## 2022-11-17 DIAGNOSIS — Z96642 Presence of left artificial hip joint: Secondary | ICD-10-CM | POA: Diagnosis not present

## 2022-11-17 DIAGNOSIS — K6389 Other specified diseases of intestine: Secondary | ICD-10-CM | POA: Diagnosis not present

## 2022-11-17 DIAGNOSIS — I454 Nonspecific intraventricular block: Secondary | ICD-10-CM | POA: Diagnosis not present

## 2022-11-17 DIAGNOSIS — D62 Acute posthemorrhagic anemia: Secondary | ICD-10-CM | POA: Diagnosis not present

## 2022-11-17 DIAGNOSIS — R000 Tachycardia, unspecified: Secondary | ICD-10-CM | POA: Diagnosis not present

## 2022-11-17 DIAGNOSIS — M2010 Hallux valgus (acquired), unspecified foot: Secondary | ICD-10-CM | POA: Diagnosis not present

## 2022-11-17 DIAGNOSIS — S72351K Displaced comminuted fracture of shaft of right femur, subsequent encounter for closed fracture with nonunion: Secondary | ICD-10-CM | POA: Diagnosis not present

## 2022-11-17 DIAGNOSIS — M810 Age-related osteoporosis without current pathological fracture: Secondary | ICD-10-CM | POA: Diagnosis not present

## 2022-11-17 DIAGNOSIS — K9189 Other postprocedural complications and disorders of digestive system: Secondary | ICD-10-CM | POA: Diagnosis not present

## 2022-11-17 DIAGNOSIS — J45909 Unspecified asthma, uncomplicated: Secondary | ICD-10-CM | POA: Diagnosis not present

## 2022-11-17 DIAGNOSIS — Z4789 Encounter for other orthopedic aftercare: Secondary | ICD-10-CM | POA: Diagnosis not present

## 2022-11-17 DIAGNOSIS — M6281 Muscle weakness (generalized): Secondary | ICD-10-CM | POA: Diagnosis not present

## 2022-11-17 DIAGNOSIS — E875 Hyperkalemia: Secondary | ICD-10-CM | POA: Diagnosis not present

## 2022-11-17 DIAGNOSIS — I447 Left bundle-branch block, unspecified: Secondary | ICD-10-CM | POA: Diagnosis not present

## 2022-11-17 DIAGNOSIS — R2681 Unsteadiness on feet: Secondary | ICD-10-CM | POA: Diagnosis not present

## 2022-11-17 DIAGNOSIS — B957 Other staphylococcus as the cause of diseases classified elsewhere: Secondary | ICD-10-CM | POA: Diagnosis not present

## 2022-11-17 DIAGNOSIS — M8580 Other specified disorders of bone density and structure, unspecified site: Secondary | ICD-10-CM | POA: Diagnosis not present

## 2022-11-18 DIAGNOSIS — R9431 Abnormal electrocardiogram [ECG] [EKG]: Secondary | ICD-10-CM | POA: Diagnosis not present

## 2022-11-18 DIAGNOSIS — S72351K Displaced comminuted fracture of shaft of right femur, subsequent encounter for closed fracture with nonunion: Secondary | ICD-10-CM | POA: Diagnosis not present

## 2022-11-18 DIAGNOSIS — L304 Erythema intertrigo: Secondary | ICD-10-CM | POA: Diagnosis not present

## 2022-11-18 DIAGNOSIS — B957 Other staphylococcus as the cause of diseases classified elsewhere: Secondary | ICD-10-CM | POA: Diagnosis not present

## 2022-11-18 DIAGNOSIS — T8469XA Infection and inflammatory reaction due to internal fixation device of other site, initial encounter: Secondary | ICD-10-CM | POA: Diagnosis not present

## 2022-11-19 DIAGNOSIS — T8469XA Infection and inflammatory reaction due to internal fixation device of other site, initial encounter: Secondary | ICD-10-CM | POA: Diagnosis not present

## 2022-11-19 DIAGNOSIS — S72351K Displaced comminuted fracture of shaft of right femur, subsequent encounter for closed fracture with nonunion: Secondary | ICD-10-CM | POA: Diagnosis not present

## 2022-11-19 DIAGNOSIS — B957 Other staphylococcus as the cause of diseases classified elsewhere: Secondary | ICD-10-CM | POA: Diagnosis not present

## 2022-11-19 DIAGNOSIS — R9431 Abnormal electrocardiogram [ECG] [EKG]: Secondary | ICD-10-CM | POA: Diagnosis not present

## 2022-11-19 DIAGNOSIS — L304 Erythema intertrigo: Secondary | ICD-10-CM | POA: Diagnosis not present

## 2022-11-19 DIAGNOSIS — I447 Left bundle-branch block, unspecified: Secondary | ICD-10-CM | POA: Diagnosis not present

## 2022-11-20 DIAGNOSIS — T8469XA Infection and inflammatory reaction due to internal fixation device of other site, initial encounter: Secondary | ICD-10-CM | POA: Diagnosis not present

## 2022-11-20 DIAGNOSIS — I454 Nonspecific intraventricular block: Secondary | ICD-10-CM | POA: Diagnosis not present

## 2022-11-20 DIAGNOSIS — S72351K Displaced comminuted fracture of shaft of right femur, subsequent encounter for closed fracture with nonunion: Secondary | ICD-10-CM | POA: Diagnosis not present

## 2022-11-20 DIAGNOSIS — R000 Tachycardia, unspecified: Secondary | ICD-10-CM | POA: Diagnosis not present

## 2022-11-20 DIAGNOSIS — R140 Abdominal distension (gaseous): Secondary | ICD-10-CM | POA: Diagnosis not present

## 2022-11-20 DIAGNOSIS — R9431 Abnormal electrocardiogram [ECG] [EKG]: Secondary | ICD-10-CM | POA: Diagnosis not present

## 2022-11-20 DIAGNOSIS — R0689 Other abnormalities of breathing: Secondary | ICD-10-CM | POA: Diagnosis not present

## 2022-11-20 DIAGNOSIS — L304 Erythema intertrigo: Secondary | ICD-10-CM | POA: Diagnosis not present

## 2022-11-21 DIAGNOSIS — I447 Left bundle-branch block, unspecified: Secondary | ICD-10-CM | POA: Diagnosis not present

## 2022-11-21 DIAGNOSIS — S72351K Displaced comminuted fracture of shaft of right femur, subsequent encounter for closed fracture with nonunion: Secondary | ICD-10-CM | POA: Diagnosis not present

## 2022-11-21 DIAGNOSIS — T8469XA Infection and inflammatory reaction due to internal fixation device of other site, initial encounter: Secondary | ICD-10-CM | POA: Diagnosis not present

## 2022-11-21 DIAGNOSIS — L304 Erythema intertrigo: Secondary | ICD-10-CM | POA: Diagnosis not present

## 2022-11-21 DIAGNOSIS — R9431 Abnormal electrocardiogram [ECG] [EKG]: Secondary | ICD-10-CM | POA: Diagnosis not present

## 2022-11-21 DIAGNOSIS — B957 Other staphylococcus as the cause of diseases classified elsewhere: Secondary | ICD-10-CM | POA: Diagnosis not present

## 2022-11-22 DIAGNOSIS — R000 Tachycardia, unspecified: Secondary | ICD-10-CM | POA: Diagnosis not present

## 2022-11-22 DIAGNOSIS — R195 Other fecal abnormalities: Secondary | ICD-10-CM | POA: Diagnosis not present

## 2022-11-22 DIAGNOSIS — S72351K Displaced comminuted fracture of shaft of right femur, subsequent encounter for closed fracture with nonunion: Secondary | ICD-10-CM | POA: Diagnosis not present

## 2022-11-22 DIAGNOSIS — K6389 Other specified diseases of intestine: Secondary | ICD-10-CM | POA: Diagnosis not present

## 2022-11-23 DIAGNOSIS — R0981 Nasal congestion: Secondary | ICD-10-CM | POA: Diagnosis not present

## 2022-11-23 DIAGNOSIS — S3210XD Unspecified fracture of sacrum, subsequent encounter for fracture with routine healing: Secondary | ICD-10-CM | POA: Diagnosis not present

## 2022-11-23 DIAGNOSIS — B957 Other staphylococcus as the cause of diseases classified elsewhere: Secondary | ICD-10-CM | POA: Diagnosis not present

## 2022-11-23 DIAGNOSIS — S79929A Unspecified injury of unspecified thigh, initial encounter: Secondary | ICD-10-CM | POA: Diagnosis not present

## 2022-11-23 DIAGNOSIS — R059 Cough, unspecified: Secondary | ICD-10-CM | POA: Diagnosis not present

## 2022-11-23 DIAGNOSIS — Z8781 Personal history of (healed) traumatic fracture: Secondary | ICD-10-CM | POA: Diagnosis not present

## 2022-11-23 DIAGNOSIS — L304 Erythema intertrigo: Secondary | ICD-10-CM | POA: Diagnosis not present

## 2022-11-23 DIAGNOSIS — S72351K Displaced comminuted fracture of shaft of right femur, subsequent encounter for closed fracture with nonunion: Secondary | ICD-10-CM | POA: Diagnosis not present

## 2022-11-23 DIAGNOSIS — I499 Cardiac arrhythmia, unspecified: Secondary | ICD-10-CM | POA: Diagnosis not present

## 2022-11-23 DIAGNOSIS — I447 Left bundle-branch block, unspecified: Secondary | ICD-10-CM | POA: Diagnosis not present

## 2022-11-23 DIAGNOSIS — R5381 Other malaise: Secondary | ICD-10-CM | POA: Diagnosis not present

## 2022-11-23 DIAGNOSIS — R278 Other lack of coordination: Secondary | ICD-10-CM | POA: Diagnosis not present

## 2022-11-23 DIAGNOSIS — Z4789 Encounter for other orthopedic aftercare: Secondary | ICD-10-CM | POA: Diagnosis not present

## 2022-11-23 DIAGNOSIS — S7292XK Unspecified fracture of left femur, subsequent encounter for closed fracture with nonunion: Secondary | ICD-10-CM | POA: Diagnosis not present

## 2022-11-23 DIAGNOSIS — A498 Other bacterial infections of unspecified site: Secondary | ICD-10-CM | POA: Diagnosis not present

## 2022-11-23 DIAGNOSIS — J449 Chronic obstructive pulmonary disease, unspecified: Secondary | ICD-10-CM | POA: Diagnosis not present

## 2022-11-23 DIAGNOSIS — M25552 Pain in left hip: Secondary | ICD-10-CM | POA: Diagnosis not present

## 2022-11-23 DIAGNOSIS — R9431 Abnormal electrocardiogram [ECG] [EKG]: Secondary | ICD-10-CM | POA: Diagnosis not present

## 2022-11-23 DIAGNOSIS — Z96652 Presence of left artificial knee joint: Secondary | ICD-10-CM | POA: Diagnosis not present

## 2022-11-23 DIAGNOSIS — I10 Essential (primary) hypertension: Secondary | ICD-10-CM | POA: Diagnosis not present

## 2022-11-23 DIAGNOSIS — R2242 Localized swelling, mass and lump, left lower limb: Secondary | ICD-10-CM | POA: Diagnosis not present

## 2022-11-23 DIAGNOSIS — Z9660 Presence of unspecified orthopedic joint implant: Secondary | ICD-10-CM | POA: Diagnosis not present

## 2022-11-23 DIAGNOSIS — S72352K Displaced comminuted fracture of shaft of left femur, subsequent encounter for closed fracture with nonunion: Secondary | ICD-10-CM | POA: Diagnosis not present

## 2022-11-23 DIAGNOSIS — R140 Abdominal distension (gaseous): Secondary | ICD-10-CM | POA: Diagnosis not present

## 2022-11-23 DIAGNOSIS — M6281 Muscle weakness (generalized): Secondary | ICD-10-CM | POA: Diagnosis not present

## 2022-11-23 DIAGNOSIS — Z8619 Personal history of other infectious and parasitic diseases: Secondary | ICD-10-CM | POA: Diagnosis not present

## 2022-11-23 DIAGNOSIS — M25572 Pain in left ankle and joints of left foot: Secondary | ICD-10-CM | POA: Diagnosis not present

## 2022-11-23 DIAGNOSIS — S72009A Fracture of unspecified part of neck of unspecified femur, initial encounter for closed fracture: Secondary | ICD-10-CM | POA: Diagnosis not present

## 2022-11-23 DIAGNOSIS — R531 Weakness: Secondary | ICD-10-CM | POA: Diagnosis not present

## 2022-11-23 DIAGNOSIS — D649 Anemia, unspecified: Secondary | ICD-10-CM | POA: Diagnosis not present

## 2022-11-23 DIAGNOSIS — Z792 Long term (current) use of antibiotics: Secondary | ICD-10-CM | POA: Diagnosis not present

## 2022-11-23 DIAGNOSIS — Z741 Need for assistance with personal care: Secondary | ICD-10-CM | POA: Diagnosis not present

## 2022-11-23 DIAGNOSIS — K219 Gastro-esophageal reflux disease without esophagitis: Secondary | ICD-10-CM | POA: Diagnosis not present

## 2022-11-23 DIAGNOSIS — S32591D Other specified fracture of right pubis, subsequent encounter for fracture with routine healing: Secondary | ICD-10-CM | POA: Diagnosis not present

## 2022-11-23 DIAGNOSIS — S32020D Wedge compression fracture of second lumbar vertebra, subsequent encounter for fracture with routine healing: Secondary | ICD-10-CM | POA: Diagnosis not present

## 2022-11-23 DIAGNOSIS — T847XXA Infection and inflammatory reaction due to other internal orthopedic prosthetic devices, implants and grafts, initial encounter: Secondary | ICD-10-CM | POA: Diagnosis not present

## 2022-11-23 DIAGNOSIS — Z471 Aftercare following joint replacement surgery: Secondary | ICD-10-CM | POA: Diagnosis not present

## 2022-11-23 DIAGNOSIS — R2689 Other abnormalities of gait and mobility: Secondary | ICD-10-CM | POA: Diagnosis not present

## 2022-11-23 DIAGNOSIS — J302 Other seasonal allergic rhinitis: Secondary | ICD-10-CM | POA: Diagnosis not present

## 2022-11-23 DIAGNOSIS — R2681 Unsteadiness on feet: Secondary | ICD-10-CM | POA: Diagnosis not present

## 2022-11-23 DIAGNOSIS — Z743 Need for continuous supervision: Secondary | ICD-10-CM | POA: Diagnosis not present

## 2022-11-23 DIAGNOSIS — M8580 Other specified disorders of bone density and structure, unspecified site: Secondary | ICD-10-CM | POA: Diagnosis not present

## 2022-11-23 DIAGNOSIS — Z96642 Presence of left artificial hip joint: Secondary | ICD-10-CM | POA: Diagnosis not present

## 2022-11-23 DIAGNOSIS — J45909 Unspecified asthma, uncomplicated: Secondary | ICD-10-CM | POA: Diagnosis not present

## 2022-11-23 DIAGNOSIS — M810 Age-related osteoporosis without current pathological fracture: Secondary | ICD-10-CM | POA: Diagnosis not present

## 2022-11-23 DIAGNOSIS — S81802A Unspecified open wound, left lower leg, initial encounter: Secondary | ICD-10-CM | POA: Diagnosis not present

## 2022-11-24 DIAGNOSIS — R5381 Other malaise: Secondary | ICD-10-CM | POA: Diagnosis not present

## 2022-11-24 DIAGNOSIS — S72352K Displaced comminuted fracture of shaft of left femur, subsequent encounter for closed fracture with nonunion: Secondary | ICD-10-CM | POA: Diagnosis not present

## 2022-11-24 DIAGNOSIS — S72009A Fracture of unspecified part of neck of unspecified femur, initial encounter for closed fracture: Secondary | ICD-10-CM | POA: Diagnosis not present

## 2022-11-24 DIAGNOSIS — I10 Essential (primary) hypertension: Secondary | ICD-10-CM | POA: Diagnosis not present

## 2022-11-24 DIAGNOSIS — J449 Chronic obstructive pulmonary disease, unspecified: Secondary | ICD-10-CM | POA: Diagnosis not present

## 2022-11-24 DIAGNOSIS — J45909 Unspecified asthma, uncomplicated: Secondary | ICD-10-CM | POA: Diagnosis not present

## 2022-11-27 DIAGNOSIS — J449 Chronic obstructive pulmonary disease, unspecified: Secondary | ICD-10-CM | POA: Diagnosis not present

## 2022-12-04 DIAGNOSIS — I10 Essential (primary) hypertension: Secondary | ICD-10-CM | POA: Diagnosis not present

## 2022-12-04 DIAGNOSIS — B957 Other staphylococcus as the cause of diseases classified elsewhere: Secondary | ICD-10-CM | POA: Diagnosis not present

## 2022-12-04 DIAGNOSIS — R9431 Abnormal electrocardiogram [ECG] [EKG]: Secondary | ICD-10-CM | POA: Diagnosis not present

## 2022-12-04 DIAGNOSIS — J449 Chronic obstructive pulmonary disease, unspecified: Secondary | ICD-10-CM | POA: Diagnosis not present

## 2022-12-04 DIAGNOSIS — T847XXA Infection and inflammatory reaction due to other internal orthopedic prosthetic devices, implants and grafts, initial encounter: Secondary | ICD-10-CM | POA: Diagnosis not present

## 2022-12-04 DIAGNOSIS — L304 Erythema intertrigo: Secondary | ICD-10-CM | POA: Diagnosis not present

## 2022-12-04 DIAGNOSIS — Z792 Long term (current) use of antibiotics: Secondary | ICD-10-CM | POA: Diagnosis not present

## 2022-12-04 DIAGNOSIS — Z9660 Presence of unspecified orthopedic joint implant: Secondary | ICD-10-CM | POA: Diagnosis not present

## 2022-12-09 DIAGNOSIS — K219 Gastro-esophageal reflux disease without esophagitis: Secondary | ICD-10-CM | POA: Diagnosis not present

## 2022-12-09 DIAGNOSIS — R059 Cough, unspecified: Secondary | ICD-10-CM | POA: Diagnosis not present

## 2022-12-09 DIAGNOSIS — J449 Chronic obstructive pulmonary disease, unspecified: Secondary | ICD-10-CM | POA: Diagnosis not present

## 2022-12-10 DIAGNOSIS — Z4789 Encounter for other orthopedic aftercare: Secondary | ICD-10-CM | POA: Diagnosis not present

## 2022-12-16 DIAGNOSIS — S72352K Displaced comminuted fracture of shaft of left femur, subsequent encounter for closed fracture with nonunion: Secondary | ICD-10-CM | POA: Diagnosis not present

## 2022-12-16 DIAGNOSIS — J45909 Unspecified asthma, uncomplicated: Secondary | ICD-10-CM | POA: Diagnosis not present

## 2022-12-16 DIAGNOSIS — S72009A Fracture of unspecified part of neck of unspecified femur, initial encounter for closed fracture: Secondary | ICD-10-CM | POA: Diagnosis not present

## 2022-12-16 DIAGNOSIS — J449 Chronic obstructive pulmonary disease, unspecified: Secondary | ICD-10-CM | POA: Diagnosis not present

## 2022-12-16 DIAGNOSIS — R5381 Other malaise: Secondary | ICD-10-CM | POA: Diagnosis not present

## 2022-12-16 DIAGNOSIS — R531 Weakness: Secondary | ICD-10-CM | POA: Diagnosis not present

## 2022-12-16 DIAGNOSIS — K219 Gastro-esophageal reflux disease without esophagitis: Secondary | ICD-10-CM | POA: Diagnosis not present

## 2022-12-17 DIAGNOSIS — S72352K Displaced comminuted fracture of shaft of left femur, subsequent encounter for closed fracture with nonunion: Secondary | ICD-10-CM | POA: Diagnosis not present

## 2022-12-17 DIAGNOSIS — R5381 Other malaise: Secondary | ICD-10-CM | POA: Diagnosis not present

## 2022-12-17 DIAGNOSIS — S72009A Fracture of unspecified part of neck of unspecified femur, initial encounter for closed fracture: Secondary | ICD-10-CM | POA: Diagnosis not present

## 2022-12-23 DIAGNOSIS — D649 Anemia, unspecified: Secondary | ICD-10-CM | POA: Diagnosis not present

## 2022-12-23 DIAGNOSIS — I10 Essential (primary) hypertension: Secondary | ICD-10-CM | POA: Diagnosis not present

## 2022-12-23 DIAGNOSIS — J449 Chronic obstructive pulmonary disease, unspecified: Secondary | ICD-10-CM | POA: Diagnosis not present

## 2022-12-24 DIAGNOSIS — Z8781 Personal history of (healed) traumatic fracture: Secondary | ICD-10-CM | POA: Diagnosis not present

## 2022-12-24 DIAGNOSIS — S72009A Fracture of unspecified part of neck of unspecified femur, initial encounter for closed fracture: Secondary | ICD-10-CM | POA: Diagnosis not present

## 2022-12-24 DIAGNOSIS — A498 Other bacterial infections of unspecified site: Secondary | ICD-10-CM | POA: Diagnosis not present

## 2022-12-30 DIAGNOSIS — R0981 Nasal congestion: Secondary | ICD-10-CM | POA: Diagnosis not present

## 2023-01-12 DIAGNOSIS — S81802A Unspecified open wound, left lower leg, initial encounter: Secondary | ICD-10-CM | POA: Diagnosis not present

## 2023-01-14 DIAGNOSIS — J449 Chronic obstructive pulmonary disease, unspecified: Secondary | ICD-10-CM | POA: Diagnosis not present

## 2023-01-14 DIAGNOSIS — Z96642 Presence of left artificial hip joint: Secondary | ICD-10-CM | POA: Diagnosis not present

## 2023-01-14 DIAGNOSIS — D649 Anemia, unspecified: Secondary | ICD-10-CM | POA: Diagnosis not present

## 2023-01-14 DIAGNOSIS — R5381 Other malaise: Secondary | ICD-10-CM | POA: Diagnosis not present

## 2023-01-14 DIAGNOSIS — I10 Essential (primary) hypertension: Secondary | ICD-10-CM | POA: Diagnosis not present

## 2023-01-14 DIAGNOSIS — S72352K Displaced comminuted fracture of shaft of left femur, subsequent encounter for closed fracture with nonunion: Secondary | ICD-10-CM | POA: Diagnosis not present

## 2023-01-14 DIAGNOSIS — Z96652 Presence of left artificial knee joint: Secondary | ICD-10-CM | POA: Diagnosis not present

## 2023-01-14 DIAGNOSIS — Z471 Aftercare following joint replacement surgery: Secondary | ICD-10-CM | POA: Diagnosis not present

## 2023-01-14 DIAGNOSIS — S72351K Displaced comminuted fracture of shaft of right femur, subsequent encounter for closed fracture with nonunion: Secondary | ICD-10-CM | POA: Diagnosis not present

## 2023-01-16 DIAGNOSIS — S72352K Displaced comminuted fracture of shaft of left femur, subsequent encounter for closed fracture with nonunion: Secondary | ICD-10-CM | POA: Diagnosis not present

## 2023-01-20 DIAGNOSIS — M25572 Pain in left ankle and joints of left foot: Secondary | ICD-10-CM | POA: Diagnosis not present

## 2023-01-29 DIAGNOSIS — J302 Other seasonal allergic rhinitis: Secondary | ICD-10-CM | POA: Diagnosis not present

## 2023-01-29 DIAGNOSIS — R059 Cough, unspecified: Secondary | ICD-10-CM | POA: Diagnosis not present

## 2023-02-02 DIAGNOSIS — I10 Essential (primary) hypertension: Secondary | ICD-10-CM | POA: Diagnosis not present

## 2023-02-02 DIAGNOSIS — D649 Anemia, unspecified: Secondary | ICD-10-CM | POA: Diagnosis not present

## 2023-02-02 DIAGNOSIS — S81802A Unspecified open wound, left lower leg, initial encounter: Secondary | ICD-10-CM | POA: Diagnosis not present

## 2023-02-02 DIAGNOSIS — S72352K Displaced comminuted fracture of shaft of left femur, subsequent encounter for closed fracture with nonunion: Secondary | ICD-10-CM | POA: Diagnosis not present

## 2023-02-02 DIAGNOSIS — J302 Other seasonal allergic rhinitis: Secondary | ICD-10-CM | POA: Diagnosis not present

## 2023-02-02 DIAGNOSIS — J449 Chronic obstructive pulmonary disease, unspecified: Secondary | ICD-10-CM | POA: Diagnosis not present

## 2023-02-02 DIAGNOSIS — J45909 Unspecified asthma, uncomplicated: Secondary | ICD-10-CM | POA: Diagnosis not present

## 2023-02-02 DIAGNOSIS — R5381 Other malaise: Secondary | ICD-10-CM | POA: Diagnosis not present

## 2023-02-02 DIAGNOSIS — S72009A Fracture of unspecified part of neck of unspecified femur, initial encounter for closed fracture: Secondary | ICD-10-CM | POA: Diagnosis not present

## 2023-02-12 DIAGNOSIS — Z789 Other specified health status: Secondary | ICD-10-CM | POA: Diagnosis not present

## 2023-02-12 DIAGNOSIS — S72351K Displaced comminuted fracture of shaft of right femur, subsequent encounter for closed fracture with nonunion: Secondary | ICD-10-CM | POA: Diagnosis not present

## 2023-02-12 DIAGNOSIS — T847XXA Infection and inflammatory reaction due to other internal orthopedic prosthetic devices, implants and grafts, initial encounter: Secondary | ICD-10-CM | POA: Diagnosis not present

## 2023-02-12 DIAGNOSIS — E876 Hypokalemia: Secondary | ICD-10-CM | POA: Diagnosis not present

## 2023-02-12 DIAGNOSIS — M810 Age-related osteoporosis without current pathological fracture: Secondary | ICD-10-CM | POA: Diagnosis not present

## 2023-02-12 DIAGNOSIS — R531 Weakness: Secondary | ICD-10-CM | POA: Diagnosis not present

## 2023-02-12 DIAGNOSIS — E871 Hypo-osmolality and hyponatremia: Secondary | ICD-10-CM | POA: Diagnosis not present

## 2023-02-12 DIAGNOSIS — I10 Essential (primary) hypertension: Secondary | ICD-10-CM | POA: Diagnosis not present

## 2023-02-12 DIAGNOSIS — M6281 Muscle weakness (generalized): Secondary | ICD-10-CM | POA: Diagnosis not present

## 2023-02-12 DIAGNOSIS — D62 Acute posthemorrhagic anemia: Secondary | ICD-10-CM | POA: Diagnosis not present

## 2023-02-12 DIAGNOSIS — J4540 Moderate persistent asthma, uncomplicated: Secondary | ICD-10-CM | POA: Diagnosis not present

## 2023-02-26 DIAGNOSIS — Z471 Aftercare following joint replacement surgery: Secondary | ICD-10-CM | POA: Diagnosis not present

## 2023-02-26 DIAGNOSIS — M12812 Other specific arthropathies, not elsewhere classified, left shoulder: Secondary | ICD-10-CM | POA: Diagnosis not present

## 2023-02-26 DIAGNOSIS — S4992XA Unspecified injury of left shoulder and upper arm, initial encounter: Secondary | ICD-10-CM | POA: Diagnosis not present

## 2023-02-26 DIAGNOSIS — Z96652 Presence of left artificial knee joint: Secondary | ICD-10-CM | POA: Diagnosis not present

## 2023-02-26 DIAGNOSIS — M19012 Primary osteoarthritis, left shoulder: Secondary | ICD-10-CM | POA: Diagnosis not present

## 2023-02-26 DIAGNOSIS — S4982XA Other specified injuries of left shoulder and upper arm, initial encounter: Secondary | ICD-10-CM | POA: Diagnosis not present

## 2023-02-26 DIAGNOSIS — M25519 Pain in unspecified shoulder: Secondary | ICD-10-CM | POA: Diagnosis not present

## 2023-02-26 DIAGNOSIS — R6889 Other general symptoms and signs: Secondary | ICD-10-CM | POA: Diagnosis not present

## 2023-02-26 DIAGNOSIS — Z96642 Presence of left artificial hip joint: Secondary | ICD-10-CM | POA: Diagnosis not present

## 2023-02-26 DIAGNOSIS — Z79899 Other long term (current) drug therapy: Secondary | ICD-10-CM | POA: Diagnosis not present

## 2023-02-26 DIAGNOSIS — Z743 Need for continuous supervision: Secondary | ICD-10-CM | POA: Diagnosis not present

## 2023-02-26 DIAGNOSIS — W19XXXA Unspecified fall, initial encounter: Secondary | ICD-10-CM | POA: Diagnosis not present

## 2023-02-26 DIAGNOSIS — Z96698 Presence of other orthopedic joint implants: Secondary | ICD-10-CM | POA: Diagnosis not present

## 2023-03-02 DIAGNOSIS — I10 Essential (primary) hypertension: Secondary | ICD-10-CM | POA: Diagnosis not present

## 2023-03-02 DIAGNOSIS — Z471 Aftercare following joint replacement surgery: Secondary | ICD-10-CM | POA: Diagnosis not present

## 2023-03-02 DIAGNOSIS — J449 Chronic obstructive pulmonary disease, unspecified: Secondary | ICD-10-CM | POA: Diagnosis not present

## 2023-03-02 DIAGNOSIS — I447 Left bundle-branch block, unspecified: Secondary | ICD-10-CM | POA: Diagnosis not present

## 2023-03-02 DIAGNOSIS — R6889 Other general symptoms and signs: Secondary | ICD-10-CM | POA: Diagnosis not present

## 2023-03-02 DIAGNOSIS — Z96642 Presence of left artificial hip joint: Secondary | ICD-10-CM | POA: Diagnosis not present

## 2023-03-02 DIAGNOSIS — Z741 Need for assistance with personal care: Secondary | ICD-10-CM | POA: Diagnosis not present

## 2023-03-02 DIAGNOSIS — T847XXA Infection and inflammatory reaction due to other internal orthopedic prosthetic devices, implants and grafts, initial encounter: Secondary | ICD-10-CM | POA: Diagnosis not present

## 2023-03-02 DIAGNOSIS — J4489 Other specified chronic obstructive pulmonary disease: Secondary | ICD-10-CM | POA: Diagnosis not present

## 2023-03-02 DIAGNOSIS — S82222D Displaced transverse fracture of shaft of left tibia, subsequent encounter for closed fracture with routine healing: Secondary | ICD-10-CM | POA: Diagnosis not present

## 2023-03-02 DIAGNOSIS — R2681 Unsteadiness on feet: Secondary | ICD-10-CM | POA: Diagnosis not present

## 2023-03-02 DIAGNOSIS — M79661 Pain in right lower leg: Secondary | ICD-10-CM | POA: Diagnosis not present

## 2023-03-02 DIAGNOSIS — I452 Bifascicular block: Secondary | ICD-10-CM | POA: Diagnosis not present

## 2023-03-02 DIAGNOSIS — R079 Chest pain, unspecified: Secondary | ICD-10-CM | POA: Diagnosis not present

## 2023-03-02 DIAGNOSIS — R278 Other lack of coordination: Secondary | ICD-10-CM | POA: Diagnosis not present

## 2023-03-02 DIAGNOSIS — S199XXA Unspecified injury of neck, initial encounter: Secondary | ICD-10-CM | POA: Diagnosis not present

## 2023-03-02 DIAGNOSIS — S81802A Unspecified open wound, left lower leg, initial encounter: Secondary | ICD-10-CM | POA: Diagnosis not present

## 2023-03-02 DIAGNOSIS — M6281 Muscle weakness (generalized): Secondary | ICD-10-CM | POA: Diagnosis not present

## 2023-03-02 DIAGNOSIS — K219 Gastro-esophageal reflux disease without esophagitis: Secondary | ICD-10-CM | POA: Diagnosis not present

## 2023-03-02 DIAGNOSIS — L97922 Non-pressure chronic ulcer of unspecified part of left lower leg with fat layer exposed: Secondary | ICD-10-CM | POA: Diagnosis not present

## 2023-03-02 DIAGNOSIS — L304 Erythema intertrigo: Secondary | ICD-10-CM | POA: Diagnosis not present

## 2023-03-02 DIAGNOSIS — Z79899 Other long term (current) drug therapy: Secondary | ICD-10-CM | POA: Diagnosis not present

## 2023-03-02 DIAGNOSIS — J9601 Acute respiratory failure with hypoxia: Secondary | ICD-10-CM | POA: Diagnosis not present

## 2023-03-02 DIAGNOSIS — S301XXA Contusion of abdominal wall, initial encounter: Secondary | ICD-10-CM | POA: Diagnosis not present

## 2023-03-02 DIAGNOSIS — R2689 Other abnormalities of gait and mobility: Secondary | ICD-10-CM | POA: Diagnosis not present

## 2023-03-02 DIAGNOSIS — Z043 Encounter for examination and observation following other accident: Secondary | ICD-10-CM | POA: Diagnosis not present

## 2023-03-02 DIAGNOSIS — R601 Generalized edema: Secondary | ICD-10-CM | POA: Diagnosis not present

## 2023-03-02 DIAGNOSIS — R55 Syncope and collapse: Secondary | ICD-10-CM | POA: Diagnosis not present

## 2023-03-02 DIAGNOSIS — Z7982 Long term (current) use of aspirin: Secondary | ICD-10-CM | POA: Diagnosis not present

## 2023-03-02 DIAGNOSIS — Z96652 Presence of left artificial knee joint: Secondary | ICD-10-CM | POA: Diagnosis not present

## 2023-03-02 DIAGNOSIS — S82832A Other fracture of upper and lower end of left fibula, initial encounter for closed fracture: Secondary | ICD-10-CM | POA: Diagnosis not present

## 2023-03-02 DIAGNOSIS — M80012A Age-related osteoporosis with current pathological fracture, left shoulder, initial encounter for fracture: Secondary | ICD-10-CM | POA: Diagnosis not present

## 2023-03-02 DIAGNOSIS — Z7951 Long term (current) use of inhaled steroids: Secondary | ICD-10-CM | POA: Diagnosis not present

## 2023-03-02 DIAGNOSIS — M25522 Pain in left elbow: Secondary | ICD-10-CM | POA: Diagnosis not present

## 2023-03-02 DIAGNOSIS — S42032A Displaced fracture of lateral end of left clavicle, initial encounter for closed fracture: Secondary | ICD-10-CM | POA: Diagnosis not present

## 2023-03-02 DIAGNOSIS — J9811 Atelectasis: Secondary | ICD-10-CM | POA: Diagnosis not present

## 2023-03-02 DIAGNOSIS — E871 Hypo-osmolality and hyponatremia: Secondary | ICD-10-CM | POA: Diagnosis not present

## 2023-03-02 DIAGNOSIS — M79672 Pain in left foot: Secondary | ICD-10-CM | POA: Diagnosis not present

## 2023-03-02 DIAGNOSIS — M80022A Age-related osteoporosis with current pathological fracture, left humerus, initial encounter for fracture: Secondary | ICD-10-CM | POA: Diagnosis not present

## 2023-03-02 DIAGNOSIS — R0602 Shortness of breath: Secondary | ICD-10-CM | POA: Diagnosis not present

## 2023-03-02 DIAGNOSIS — M9712XA Periprosthetic fracture around internal prosthetic left knee joint, initial encounter: Secondary | ICD-10-CM | POA: Diagnosis not present

## 2023-03-02 DIAGNOSIS — M25572 Pain in left ankle and joints of left foot: Secondary | ICD-10-CM | POA: Diagnosis not present

## 2023-03-02 DIAGNOSIS — R531 Weakness: Secondary | ICD-10-CM | POA: Diagnosis not present

## 2023-03-02 DIAGNOSIS — G4700 Insomnia, unspecified: Secondary | ICD-10-CM | POA: Diagnosis not present

## 2023-03-02 DIAGNOSIS — I080 Rheumatic disorders of both mitral and aortic valves: Secondary | ICD-10-CM | POA: Diagnosis not present

## 2023-03-02 DIAGNOSIS — L97929 Non-pressure chronic ulcer of unspecified part of left lower leg with unspecified severity: Secondary | ICD-10-CM | POA: Diagnosis not present

## 2023-03-02 DIAGNOSIS — S42002A Fracture of unspecified part of left clavicle, initial encounter for closed fracture: Secondary | ICD-10-CM | POA: Diagnosis not present

## 2023-03-02 DIAGNOSIS — M80062A Age-related osteoporosis with current pathological fracture, left lower leg, initial encounter for fracture: Secondary | ICD-10-CM | POA: Diagnosis not present

## 2023-03-02 DIAGNOSIS — S82252A Displaced comminuted fracture of shaft of left tibia, initial encounter for closed fracture: Secondary | ICD-10-CM | POA: Diagnosis not present

## 2023-03-02 DIAGNOSIS — S82452A Displaced comminuted fracture of shaft of left fibula, initial encounter for closed fracture: Secondary | ICD-10-CM | POA: Diagnosis not present

## 2023-03-02 DIAGNOSIS — S42022A Displaced fracture of shaft of left clavicle, initial encounter for closed fracture: Secondary | ICD-10-CM | POA: Diagnosis not present

## 2023-03-02 DIAGNOSIS — M545 Low back pain, unspecified: Secondary | ICD-10-CM | POA: Diagnosis not present

## 2023-03-02 DIAGNOSIS — R000 Tachycardia, unspecified: Secondary | ICD-10-CM | POA: Diagnosis not present

## 2023-03-02 DIAGNOSIS — S82202A Unspecified fracture of shaft of left tibia, initial encounter for closed fracture: Secondary | ICD-10-CM | POA: Diagnosis not present

## 2023-03-02 DIAGNOSIS — Z9981 Dependence on supplemental oxygen: Secondary | ICD-10-CM | POA: Diagnosis not present

## 2023-03-02 DIAGNOSIS — W19XXXA Unspecified fall, initial encounter: Secondary | ICD-10-CM | POA: Diagnosis not present

## 2023-03-02 DIAGNOSIS — S82402A Unspecified fracture of shaft of left fibula, initial encounter for closed fracture: Secondary | ICD-10-CM | POA: Diagnosis not present

## 2023-03-03 DIAGNOSIS — S82202A Unspecified fracture of shaft of left tibia, initial encounter for closed fracture: Secondary | ICD-10-CM | POA: Diagnosis not present

## 2023-03-03 DIAGNOSIS — R0602 Shortness of breath: Secondary | ICD-10-CM | POA: Diagnosis not present

## 2023-03-03 DIAGNOSIS — S82402A Unspecified fracture of shaft of left fibula, initial encounter for closed fracture: Secondary | ICD-10-CM | POA: Diagnosis not present

## 2023-03-03 DIAGNOSIS — I447 Left bundle-branch block, unspecified: Secondary | ICD-10-CM | POA: Diagnosis not present

## 2023-03-03 DIAGNOSIS — R000 Tachycardia, unspecified: Secondary | ICD-10-CM | POA: Diagnosis not present

## 2023-03-03 DIAGNOSIS — M9712XA Periprosthetic fracture around internal prosthetic left knee joint, initial encounter: Secondary | ICD-10-CM | POA: Diagnosis not present

## 2023-03-03 DIAGNOSIS — S82252A Displaced comminuted fracture of shaft of left tibia, initial encounter for closed fracture: Secondary | ICD-10-CM | POA: Diagnosis not present

## 2023-03-04 DIAGNOSIS — I080 Rheumatic disorders of both mitral and aortic valves: Secondary | ICD-10-CM | POA: Diagnosis not present

## 2023-03-04 DIAGNOSIS — S82202A Unspecified fracture of shaft of left tibia, initial encounter for closed fracture: Secondary | ICD-10-CM | POA: Diagnosis not present

## 2023-03-04 DIAGNOSIS — R55 Syncope and collapse: Secondary | ICD-10-CM | POA: Diagnosis not present

## 2023-03-04 DIAGNOSIS — S82402A Unspecified fracture of shaft of left fibula, initial encounter for closed fracture: Secondary | ICD-10-CM | POA: Diagnosis not present

## 2023-03-05 DIAGNOSIS — T847XXA Infection and inflammatory reaction due to other internal orthopedic prosthetic devices, implants and grafts, initial encounter: Secondary | ICD-10-CM | POA: Diagnosis not present

## 2023-03-05 DIAGNOSIS — L97922 Non-pressure chronic ulcer of unspecified part of left lower leg with fat layer exposed: Secondary | ICD-10-CM | POA: Diagnosis not present

## 2023-03-05 DIAGNOSIS — S82202A Unspecified fracture of shaft of left tibia, initial encounter for closed fracture: Secondary | ICD-10-CM | POA: Diagnosis not present

## 2023-03-05 DIAGNOSIS — R601 Generalized edema: Secondary | ICD-10-CM | POA: Diagnosis not present

## 2023-03-05 DIAGNOSIS — S81802A Unspecified open wound, left lower leg, initial encounter: Secondary | ICD-10-CM | POA: Diagnosis not present

## 2023-03-05 DIAGNOSIS — S82402A Unspecified fracture of shaft of left fibula, initial encounter for closed fracture: Secondary | ICD-10-CM | POA: Diagnosis not present

## 2023-03-06 DIAGNOSIS — R2681 Unsteadiness on feet: Secondary | ICD-10-CM | POA: Diagnosis not present

## 2023-03-06 DIAGNOSIS — S42302D Unspecified fracture of shaft of humerus, left arm, subsequent encounter for fracture with routine healing: Secondary | ICD-10-CM | POA: Diagnosis not present

## 2023-03-06 DIAGNOSIS — S82202A Unspecified fracture of shaft of left tibia, initial encounter for closed fracture: Secondary | ICD-10-CM | POA: Diagnosis not present

## 2023-03-06 DIAGNOSIS — S82402A Unspecified fracture of shaft of left fibula, initial encounter for closed fracture: Secondary | ICD-10-CM | POA: Diagnosis not present

## 2023-03-06 DIAGNOSIS — R278 Other lack of coordination: Secondary | ICD-10-CM | POA: Diagnosis not present

## 2023-03-06 DIAGNOSIS — J45909 Unspecified asthma, uncomplicated: Secondary | ICD-10-CM | POA: Diagnosis not present

## 2023-03-06 DIAGNOSIS — S82222D Displaced transverse fracture of shaft of left tibia, subsequent encounter for closed fracture with routine healing: Secondary | ICD-10-CM | POA: Diagnosis not present

## 2023-03-06 DIAGNOSIS — Z96652 Presence of left artificial knee joint: Secondary | ICD-10-CM | POA: Diagnosis not present

## 2023-03-06 DIAGNOSIS — S82832D Other fracture of upper and lower end of left fibula, subsequent encounter for closed fracture with routine healing: Secondary | ICD-10-CM | POA: Diagnosis not present

## 2023-03-06 DIAGNOSIS — S81802A Unspecified open wound, left lower leg, initial encounter: Secondary | ICD-10-CM | POA: Diagnosis not present

## 2023-03-06 DIAGNOSIS — S72352K Displaced comminuted fracture of shaft of left femur, subsequent encounter for closed fracture with nonunion: Secondary | ICD-10-CM | POA: Diagnosis not present

## 2023-03-06 DIAGNOSIS — I10 Essential (primary) hypertension: Secondary | ICD-10-CM | POA: Diagnosis not present

## 2023-03-06 DIAGNOSIS — M6281 Muscle weakness (generalized): Secondary | ICD-10-CM | POA: Diagnosis not present

## 2023-03-06 DIAGNOSIS — Z741 Need for assistance with personal care: Secondary | ICD-10-CM | POA: Diagnosis not present

## 2023-03-06 DIAGNOSIS — R531 Weakness: Secondary | ICD-10-CM | POA: Diagnosis not present

## 2023-03-06 DIAGNOSIS — Z9981 Dependence on supplemental oxygen: Secondary | ICD-10-CM | POA: Diagnosis not present

## 2023-03-06 DIAGNOSIS — R2689 Other abnormalities of gait and mobility: Secondary | ICD-10-CM | POA: Diagnosis not present

## 2023-03-06 DIAGNOSIS — S42002A Fracture of unspecified part of left clavicle, initial encounter for closed fracture: Secondary | ICD-10-CM | POA: Diagnosis not present

## 2023-03-06 DIAGNOSIS — S82302D Unspecified fracture of lower end of left tibia, subsequent encounter for closed fracture with routine healing: Secondary | ICD-10-CM | POA: Diagnosis not present

## 2023-03-06 DIAGNOSIS — R5381 Other malaise: Secondary | ICD-10-CM | POA: Diagnosis not present

## 2023-03-06 DIAGNOSIS — J449 Chronic obstructive pulmonary disease, unspecified: Secondary | ICD-10-CM | POA: Diagnosis not present

## 2023-03-06 DIAGNOSIS — M25572 Pain in left ankle and joints of left foot: Secondary | ICD-10-CM | POA: Diagnosis not present

## 2023-03-09 DIAGNOSIS — S82302D Unspecified fracture of lower end of left tibia, subsequent encounter for closed fracture with routine healing: Secondary | ICD-10-CM | POA: Diagnosis not present

## 2023-03-09 DIAGNOSIS — S42002A Fracture of unspecified part of left clavicle, initial encounter for closed fracture: Secondary | ICD-10-CM | POA: Diagnosis not present

## 2023-03-09 DIAGNOSIS — J45909 Unspecified asthma, uncomplicated: Secondary | ICD-10-CM | POA: Diagnosis not present

## 2023-03-09 DIAGNOSIS — I10 Essential (primary) hypertension: Secondary | ICD-10-CM | POA: Diagnosis not present

## 2023-03-09 DIAGNOSIS — J449 Chronic obstructive pulmonary disease, unspecified: Secondary | ICD-10-CM | POA: Diagnosis not present

## 2023-03-09 DIAGNOSIS — S81802A Unspecified open wound, left lower leg, initial encounter: Secondary | ICD-10-CM | POA: Diagnosis not present

## 2023-03-09 DIAGNOSIS — S42302D Unspecified fracture of shaft of humerus, left arm, subsequent encounter for fracture with routine healing: Secondary | ICD-10-CM | POA: Diagnosis not present

## 2023-03-09 DIAGNOSIS — S82832D Other fracture of upper and lower end of left fibula, subsequent encounter for closed fracture with routine healing: Secondary | ICD-10-CM | POA: Diagnosis not present

## 2023-03-11 DIAGNOSIS — Z96652 Presence of left artificial knee joint: Secondary | ICD-10-CM | POA: Diagnosis not present

## 2023-03-11 DIAGNOSIS — S82222D Displaced transverse fracture of shaft of left tibia, subsequent encounter for closed fracture with routine healing: Secondary | ICD-10-CM | POA: Diagnosis not present

## 2023-03-11 DIAGNOSIS — S82832D Other fracture of upper and lower end of left fibula, subsequent encounter for closed fracture with routine healing: Secondary | ICD-10-CM | POA: Diagnosis not present

## 2023-03-12 DIAGNOSIS — J449 Chronic obstructive pulmonary disease, unspecified: Secondary | ICD-10-CM | POA: Diagnosis not present

## 2023-03-12 DIAGNOSIS — S82302D Unspecified fracture of lower end of left tibia, subsequent encounter for closed fracture with routine healing: Secondary | ICD-10-CM | POA: Diagnosis not present

## 2023-03-12 DIAGNOSIS — S82832D Other fracture of upper and lower end of left fibula, subsequent encounter for closed fracture with routine healing: Secondary | ICD-10-CM | POA: Diagnosis not present

## 2023-03-13 DIAGNOSIS — I10 Essential (primary) hypertension: Secondary | ICD-10-CM | POA: Diagnosis not present

## 2023-03-13 DIAGNOSIS — S42302D Unspecified fracture of shaft of humerus, left arm, subsequent encounter for fracture with routine healing: Secondary | ICD-10-CM | POA: Diagnosis not present

## 2023-03-13 DIAGNOSIS — S82832D Other fracture of upper and lower end of left fibula, subsequent encounter for closed fracture with routine healing: Secondary | ICD-10-CM | POA: Diagnosis not present

## 2023-03-13 DIAGNOSIS — S72352K Displaced comminuted fracture of shaft of left femur, subsequent encounter for closed fracture with nonunion: Secondary | ICD-10-CM | POA: Diagnosis not present

## 2023-03-13 DIAGNOSIS — J449 Chronic obstructive pulmonary disease, unspecified: Secondary | ICD-10-CM | POA: Diagnosis not present

## 2023-03-13 DIAGNOSIS — S82302D Unspecified fracture of lower end of left tibia, subsequent encounter for closed fracture with routine healing: Secondary | ICD-10-CM | POA: Diagnosis not present

## 2023-03-13 DIAGNOSIS — S42002A Fracture of unspecified part of left clavicle, initial encounter for closed fracture: Secondary | ICD-10-CM | POA: Diagnosis not present

## 2023-03-16 DIAGNOSIS — J449 Chronic obstructive pulmonary disease, unspecified: Secondary | ICD-10-CM | POA: Diagnosis not present

## 2023-03-16 DIAGNOSIS — S42302D Unspecified fracture of shaft of humerus, left arm, subsequent encounter for fracture with routine healing: Secondary | ICD-10-CM | POA: Diagnosis not present

## 2023-03-16 DIAGNOSIS — I10 Essential (primary) hypertension: Secondary | ICD-10-CM | POA: Diagnosis not present

## 2023-03-16 DIAGNOSIS — S72352K Displaced comminuted fracture of shaft of left femur, subsequent encounter for closed fracture with nonunion: Secondary | ICD-10-CM | POA: Diagnosis not present

## 2023-03-16 DIAGNOSIS — R5381 Other malaise: Secondary | ICD-10-CM | POA: Diagnosis not present

## 2023-03-16 DIAGNOSIS — S42002A Fracture of unspecified part of left clavicle, initial encounter for closed fracture: Secondary | ICD-10-CM | POA: Diagnosis not present

## 2023-03-16 DIAGNOSIS — S82832D Other fracture of upper and lower end of left fibula, subsequent encounter for closed fracture with routine healing: Secondary | ICD-10-CM | POA: Diagnosis not present

## 2023-03-19 DIAGNOSIS — S83105A Unspecified dislocation of left knee, initial encounter: Secondary | ICD-10-CM | POA: Diagnosis not present

## 2023-03-19 DIAGNOSIS — I4892 Unspecified atrial flutter: Secondary | ICD-10-CM | POA: Diagnosis not present

## 2023-03-19 DIAGNOSIS — S83005A Unspecified dislocation of left patella, initial encounter: Secondary | ICD-10-CM | POA: Diagnosis not present

## 2023-03-19 DIAGNOSIS — Z7401 Bed confinement status: Secondary | ICD-10-CM | POA: Diagnosis not present

## 2023-03-19 DIAGNOSIS — M23672 Other spontaneous disruption of capsular ligament of left knee: Secondary | ICD-10-CM | POA: Diagnosis not present

## 2023-03-19 DIAGNOSIS — R279 Unspecified lack of coordination: Secondary | ICD-10-CM | POA: Diagnosis not present

## 2023-03-19 DIAGNOSIS — Z743 Need for continuous supervision: Secondary | ICD-10-CM | POA: Diagnosis not present

## 2023-03-19 DIAGNOSIS — M25569 Pain in unspecified knee: Secondary | ICD-10-CM | POA: Diagnosis not present

## 2023-03-20 DIAGNOSIS — M2550 Pain in unspecified joint: Secondary | ICD-10-CM | POA: Diagnosis not present

## 2023-03-20 DIAGNOSIS — R262 Difficulty in walking, not elsewhere classified: Secondary | ICD-10-CM | POA: Diagnosis not present

## 2023-03-20 DIAGNOSIS — R5381 Other malaise: Secondary | ICD-10-CM | POA: Diagnosis not present

## 2023-03-20 DIAGNOSIS — I739 Peripheral vascular disease, unspecified: Secondary | ICD-10-CM | POA: Diagnosis not present

## 2023-03-20 DIAGNOSIS — K5900 Constipation, unspecified: Secondary | ICD-10-CM | POA: Diagnosis not present

## 2023-03-20 DIAGNOSIS — M810 Age-related osteoporosis without current pathological fracture: Secondary | ICD-10-CM | POA: Diagnosis not present

## 2023-03-20 DIAGNOSIS — J449 Chronic obstructive pulmonary disease, unspecified: Secondary | ICD-10-CM | POA: Diagnosis not present

## 2023-03-20 DIAGNOSIS — G894 Chronic pain syndrome: Secondary | ICD-10-CM | POA: Diagnosis not present

## 2023-03-20 DIAGNOSIS — G8918 Other acute postprocedural pain: Secondary | ICD-10-CM | POA: Diagnosis not present

## 2023-03-25 DIAGNOSIS — S82222D Displaced transverse fracture of shaft of left tibia, subsequent encounter for closed fracture with routine healing: Secondary | ICD-10-CM | POA: Diagnosis not present

## 2023-03-31 DIAGNOSIS — Z9181 History of falling: Secondary | ICD-10-CM | POA: Diagnosis not present

## 2023-03-31 DIAGNOSIS — Z87310 Personal history of (healed) osteoporosis fracture: Secondary | ICD-10-CM | POA: Diagnosis not present

## 2023-03-31 DIAGNOSIS — Z8781 Personal history of (healed) traumatic fracture: Secondary | ICD-10-CM | POA: Diagnosis not present

## 2023-03-31 DIAGNOSIS — S82402A Unspecified fracture of shaft of left fibula, initial encounter for closed fracture: Secondary | ICD-10-CM | POA: Diagnosis not present

## 2023-03-31 DIAGNOSIS — S72002D Fracture of unspecified part of neck of left femur, subsequent encounter for closed fracture with routine healing: Secondary | ICD-10-CM | POA: Diagnosis not present

## 2023-03-31 DIAGNOSIS — Z96642 Presence of left artificial hip joint: Secondary | ICD-10-CM | POA: Diagnosis not present

## 2023-03-31 DIAGNOSIS — S82202A Unspecified fracture of shaft of left tibia, initial encounter for closed fracture: Secondary | ICD-10-CM | POA: Diagnosis not present

## 2023-03-31 DIAGNOSIS — M810 Age-related osteoporosis without current pathological fracture: Secondary | ICD-10-CM | POA: Diagnosis not present

## 2023-04-01 DIAGNOSIS — M810 Age-related osteoporosis without current pathological fracture: Secondary | ICD-10-CM | POA: Diagnosis not present

## 2023-04-01 DIAGNOSIS — S82222D Displaced transverse fracture of shaft of left tibia, subsequent encounter for closed fracture with routine healing: Secondary | ICD-10-CM | POA: Diagnosis not present

## 2023-04-01 DIAGNOSIS — R2689 Other abnormalities of gait and mobility: Secondary | ICD-10-CM | POA: Diagnosis not present

## 2023-04-07 DIAGNOSIS — S82252A Displaced comminuted fracture of shaft of left tibia, initial encounter for closed fracture: Secondary | ICD-10-CM | POA: Diagnosis not present

## 2023-04-07 DIAGNOSIS — S7292XA Unspecified fracture of left femur, initial encounter for closed fracture: Secondary | ICD-10-CM | POA: Diagnosis not present

## 2023-04-07 DIAGNOSIS — M25532 Pain in left wrist: Secondary | ICD-10-CM | POA: Diagnosis not present

## 2023-04-07 DIAGNOSIS — S82452A Displaced comminuted fracture of shaft of left fibula, initial encounter for closed fracture: Secondary | ICD-10-CM | POA: Diagnosis not present

## 2023-04-07 DIAGNOSIS — S42032A Displaced fracture of lateral end of left clavicle, initial encounter for closed fracture: Secondary | ICD-10-CM | POA: Diagnosis not present

## 2023-04-07 DIAGNOSIS — S5012XA Contusion of left forearm, initial encounter: Secondary | ICD-10-CM | POA: Diagnosis not present

## 2023-04-07 DIAGNOSIS — R279 Unspecified lack of coordination: Secondary | ICD-10-CM | POA: Diagnosis not present

## 2023-04-07 DIAGNOSIS — R519 Headache, unspecified: Secondary | ICD-10-CM | POA: Diagnosis not present

## 2023-04-07 DIAGNOSIS — W19XXXA Unspecified fall, initial encounter: Secondary | ICD-10-CM | POA: Diagnosis not present

## 2023-04-07 DIAGNOSIS — M1990 Unspecified osteoarthritis, unspecified site: Secondary | ICD-10-CM | POA: Diagnosis not present

## 2023-04-07 DIAGNOSIS — W1830XA Fall on same level, unspecified, initial encounter: Secondary | ICD-10-CM | POA: Diagnosis not present

## 2023-04-07 DIAGNOSIS — Y92129 Unspecified place in nursing home as the place of occurrence of the external cause: Secondary | ICD-10-CM | POA: Diagnosis not present

## 2023-04-07 DIAGNOSIS — I4891 Unspecified atrial fibrillation: Secondary | ICD-10-CM | POA: Diagnosis not present

## 2023-04-07 DIAGNOSIS — Z7401 Bed confinement status: Secondary | ICD-10-CM | POA: Diagnosis not present

## 2023-04-07 DIAGNOSIS — R410 Disorientation, unspecified: Secondary | ICD-10-CM | POA: Diagnosis not present

## 2023-04-07 DIAGNOSIS — S40012A Contusion of left shoulder, initial encounter: Secondary | ICD-10-CM | POA: Diagnosis not present

## 2023-04-07 DIAGNOSIS — Z043 Encounter for examination and observation following other accident: Secondary | ICD-10-CM | POA: Diagnosis not present

## 2023-04-07 DIAGNOSIS — S9002XA Contusion of left ankle, initial encounter: Secondary | ICD-10-CM | POA: Diagnosis not present

## 2023-04-07 DIAGNOSIS — M79632 Pain in left forearm: Secondary | ICD-10-CM | POA: Diagnosis not present

## 2023-04-07 DIAGNOSIS — M542 Cervicalgia: Secondary | ICD-10-CM | POA: Diagnosis not present

## 2023-04-10 DIAGNOSIS — M2550 Pain in unspecified joint: Secondary | ICD-10-CM | POA: Diagnosis not present

## 2023-04-10 DIAGNOSIS — R262 Difficulty in walking, not elsewhere classified: Secondary | ICD-10-CM | POA: Diagnosis not present

## 2023-04-14 DIAGNOSIS — S82222D Displaced transverse fracture of shaft of left tibia, subsequent encounter for closed fracture with routine healing: Secondary | ICD-10-CM | POA: Diagnosis not present

## 2023-04-14 DIAGNOSIS — M810 Age-related osteoporosis without current pathological fracture: Secondary | ICD-10-CM | POA: Diagnosis not present

## 2023-04-14 DIAGNOSIS — R2689 Other abnormalities of gait and mobility: Secondary | ICD-10-CM | POA: Diagnosis not present

## 2023-04-15 DIAGNOSIS — R2689 Other abnormalities of gait and mobility: Secondary | ICD-10-CM | POA: Diagnosis not present

## 2023-04-15 DIAGNOSIS — S82222D Displaced transverse fracture of shaft of left tibia, subsequent encounter for closed fracture with routine healing: Secondary | ICD-10-CM | POA: Diagnosis not present

## 2023-04-15 DIAGNOSIS — M810 Age-related osteoporosis without current pathological fracture: Secondary | ICD-10-CM | POA: Diagnosis not present

## 2023-04-16 DIAGNOSIS — S82222D Displaced transverse fracture of shaft of left tibia, subsequent encounter for closed fracture with routine healing: Secondary | ICD-10-CM | POA: Diagnosis not present

## 2023-04-16 DIAGNOSIS — R2689 Other abnormalities of gait and mobility: Secondary | ICD-10-CM | POA: Diagnosis not present

## 2023-04-16 DIAGNOSIS — M810 Age-related osteoporosis without current pathological fracture: Secondary | ICD-10-CM | POA: Diagnosis not present

## 2023-04-17 DIAGNOSIS — M810 Age-related osteoporosis without current pathological fracture: Secondary | ICD-10-CM | POA: Diagnosis not present

## 2023-04-17 DIAGNOSIS — R2689 Other abnormalities of gait and mobility: Secondary | ICD-10-CM | POA: Diagnosis not present

## 2023-04-17 DIAGNOSIS — S82222D Displaced transverse fracture of shaft of left tibia, subsequent encounter for closed fracture with routine healing: Secondary | ICD-10-CM | POA: Diagnosis not present

## 2023-04-20 DIAGNOSIS — M810 Age-related osteoporosis without current pathological fracture: Secondary | ICD-10-CM | POA: Diagnosis not present

## 2023-04-20 DIAGNOSIS — S82402D Unspecified fracture of shaft of left fibula, subsequent encounter for closed fracture with routine healing: Secondary | ICD-10-CM | POA: Diagnosis not present

## 2023-04-20 DIAGNOSIS — S82222D Displaced transverse fracture of shaft of left tibia, subsequent encounter for closed fracture with routine healing: Secondary | ICD-10-CM | POA: Diagnosis not present

## 2023-04-20 DIAGNOSIS — S82202D Unspecified fracture of shaft of left tibia, subsequent encounter for closed fracture with routine healing: Secondary | ICD-10-CM | POA: Diagnosis not present

## 2023-04-20 DIAGNOSIS — S42012D Anterior displaced fracture of sternal end of left clavicle, subsequent encounter for fracture with routine healing: Secondary | ICD-10-CM | POA: Diagnosis not present

## 2023-04-20 DIAGNOSIS — S42032D Displaced fracture of lateral end of left clavicle, subsequent encounter for fracture with routine healing: Secondary | ICD-10-CM | POA: Diagnosis not present

## 2023-04-20 DIAGNOSIS — Z87310 Personal history of (healed) osteoporosis fracture: Secondary | ICD-10-CM | POA: Diagnosis not present

## 2023-04-20 DIAGNOSIS — R2689 Other abnormalities of gait and mobility: Secondary | ICD-10-CM | POA: Diagnosis not present

## 2023-04-28 DIAGNOSIS — E559 Vitamin D deficiency, unspecified: Secondary | ICD-10-CM | POA: Diagnosis not present

## 2023-04-28 DIAGNOSIS — K219 Gastro-esophageal reflux disease without esophagitis: Secondary | ICD-10-CM | POA: Diagnosis not present

## 2023-04-28 DIAGNOSIS — M810 Age-related osteoporosis without current pathological fracture: Secondary | ICD-10-CM | POA: Diagnosis not present

## 2023-04-28 DIAGNOSIS — E039 Hypothyroidism, unspecified: Secondary | ICD-10-CM | POA: Diagnosis not present

## 2023-04-28 DIAGNOSIS — J449 Chronic obstructive pulmonary disease, unspecified: Secondary | ICD-10-CM | POA: Diagnosis not present

## 2023-05-11 DIAGNOSIS — D649 Anemia, unspecified: Secondary | ICD-10-CM | POA: Diagnosis not present

## 2023-05-11 DIAGNOSIS — K219 Gastro-esophageal reflux disease without esophagitis: Secondary | ICD-10-CM | POA: Diagnosis not present

## 2023-05-11 DIAGNOSIS — I119 Hypertensive heart disease without heart failure: Secondary | ICD-10-CM | POA: Diagnosis not present

## 2023-05-11 DIAGNOSIS — J449 Chronic obstructive pulmonary disease, unspecified: Secondary | ICD-10-CM | POA: Diagnosis not present

## 2023-05-21 DIAGNOSIS — Z792 Long term (current) use of antibiotics: Secondary | ICD-10-CM | POA: Diagnosis not present

## 2023-05-21 DIAGNOSIS — A498 Other bacterial infections of unspecified site: Secondary | ICD-10-CM | POA: Diagnosis not present

## 2023-05-21 DIAGNOSIS — M86252 Subacute osteomyelitis, left femur: Secondary | ICD-10-CM | POA: Diagnosis not present

## 2023-05-21 DIAGNOSIS — Z978 Presence of other specified devices: Secondary | ICD-10-CM | POA: Diagnosis not present

## 2023-06-02 DIAGNOSIS — M810 Age-related osteoporosis without current pathological fracture: Secondary | ICD-10-CM | POA: Diagnosis not present

## 2023-06-02 DIAGNOSIS — A498 Other bacterial infections of unspecified site: Secondary | ICD-10-CM | POA: Diagnosis not present

## 2023-06-10 DIAGNOSIS — G894 Chronic pain syndrome: Secondary | ICD-10-CM | POA: Diagnosis not present

## 2023-06-10 DIAGNOSIS — M810 Age-related osteoporosis without current pathological fracture: Secondary | ICD-10-CM | POA: Diagnosis not present

## 2023-06-10 DIAGNOSIS — M2550 Pain in unspecified joint: Secondary | ICD-10-CM | POA: Diagnosis not present

## 2023-06-10 DIAGNOSIS — K219 Gastro-esophageal reflux disease without esophagitis: Secondary | ICD-10-CM | POA: Diagnosis not present

## 2023-06-10 DIAGNOSIS — D649 Anemia, unspecified: Secondary | ICD-10-CM | POA: Diagnosis not present

## 2023-06-10 DIAGNOSIS — I739 Peripheral vascular disease, unspecified: Secondary | ICD-10-CM | POA: Diagnosis not present

## 2023-06-10 DIAGNOSIS — R5381 Other malaise: Secondary | ICD-10-CM | POA: Diagnosis not present

## 2023-06-17 DIAGNOSIS — S82222D Displaced transverse fracture of shaft of left tibia, subsequent encounter for closed fracture with routine healing: Secondary | ICD-10-CM | POA: Diagnosis not present

## 2023-06-17 DIAGNOSIS — S82202A Unspecified fracture of shaft of left tibia, initial encounter for closed fracture: Secondary | ICD-10-CM | POA: Diagnosis not present

## 2023-06-17 DIAGNOSIS — S42012D Anterior displaced fracture of sternal end of left clavicle, subsequent encounter for fracture with routine healing: Secondary | ICD-10-CM | POA: Diagnosis not present

## 2023-06-17 DIAGNOSIS — S82402A Unspecified fracture of shaft of left fibula, initial encounter for closed fracture: Secondary | ICD-10-CM | POA: Diagnosis not present

## 2023-06-18 DIAGNOSIS — L602 Onychogryphosis: Secondary | ICD-10-CM | POA: Diagnosis not present

## 2023-06-18 DIAGNOSIS — I739 Peripheral vascular disease, unspecified: Secondary | ICD-10-CM | POA: Diagnosis not present

## 2023-06-18 DIAGNOSIS — L603 Nail dystrophy: Secondary | ICD-10-CM | POA: Diagnosis not present

## 2023-06-22 DIAGNOSIS — M1711 Unilateral primary osteoarthritis, right knee: Secondary | ICD-10-CM | POA: Diagnosis not present

## 2023-07-06 DIAGNOSIS — M810 Age-related osteoporosis without current pathological fracture: Secondary | ICD-10-CM | POA: Diagnosis not present

## 2023-07-10 DIAGNOSIS — M2550 Pain in unspecified joint: Secondary | ICD-10-CM | POA: Diagnosis not present

## 2023-07-10 DIAGNOSIS — M810 Age-related osteoporosis without current pathological fracture: Secondary | ICD-10-CM | POA: Diagnosis not present

## 2023-07-10 DIAGNOSIS — I119 Hypertensive heart disease without heart failure: Secondary | ICD-10-CM | POA: Diagnosis not present

## 2023-07-10 DIAGNOSIS — K5900 Constipation, unspecified: Secondary | ICD-10-CM | POA: Diagnosis not present

## 2023-07-10 DIAGNOSIS — D649 Anemia, unspecified: Secondary | ICD-10-CM | POA: Diagnosis not present

## 2023-07-10 DIAGNOSIS — K219 Gastro-esophageal reflux disease without esophagitis: Secondary | ICD-10-CM | POA: Diagnosis not present

## 2023-07-10 DIAGNOSIS — R5381 Other malaise: Secondary | ICD-10-CM | POA: Diagnosis not present

## 2023-07-10 DIAGNOSIS — G894 Chronic pain syndrome: Secondary | ICD-10-CM | POA: Diagnosis not present

## 2023-07-10 DIAGNOSIS — J449 Chronic obstructive pulmonary disease, unspecified: Secondary | ICD-10-CM | POA: Diagnosis not present

## 2023-07-10 DIAGNOSIS — N1831 Chronic kidney disease, stage 3a: Secondary | ICD-10-CM | POA: Diagnosis not present

## 2023-07-10 DIAGNOSIS — I739 Peripheral vascular disease, unspecified: Secondary | ICD-10-CM | POA: Diagnosis not present

## 2023-07-15 DIAGNOSIS — Z96652 Presence of left artificial knee joint: Secondary | ICD-10-CM | POA: Diagnosis not present

## 2023-07-15 DIAGNOSIS — M25462 Effusion, left knee: Secondary | ICD-10-CM | POA: Diagnosis not present

## 2023-07-15 DIAGNOSIS — Z96642 Presence of left artificial hip joint: Secondary | ICD-10-CM | POA: Diagnosis not present

## 2023-07-27 DIAGNOSIS — N390 Urinary tract infection, site not specified: Secondary | ICD-10-CM | POA: Diagnosis not present

## 2023-08-07 DIAGNOSIS — D649 Anemia, unspecified: Secondary | ICD-10-CM | POA: Diagnosis not present

## 2023-08-07 DIAGNOSIS — I119 Hypertensive heart disease without heart failure: Secondary | ICD-10-CM | POA: Diagnosis not present

## 2023-08-08 DIAGNOSIS — R5381 Other malaise: Secondary | ICD-10-CM | POA: Diagnosis not present

## 2023-08-08 DIAGNOSIS — N1831 Chronic kidney disease, stage 3a: Secondary | ICD-10-CM | POA: Diagnosis not present

## 2023-08-08 DIAGNOSIS — M810 Age-related osteoporosis without current pathological fracture: Secondary | ICD-10-CM | POA: Diagnosis not present

## 2023-08-08 DIAGNOSIS — G894 Chronic pain syndrome: Secondary | ICD-10-CM | POA: Diagnosis not present

## 2023-08-08 DIAGNOSIS — K5900 Constipation, unspecified: Secondary | ICD-10-CM | POA: Diagnosis not present

## 2023-08-08 DIAGNOSIS — E871 Hypo-osmolality and hyponatremia: Secondary | ICD-10-CM | POA: Diagnosis not present

## 2023-08-08 DIAGNOSIS — S72351D Displaced comminuted fracture of shaft of right femur, subsequent encounter for closed fracture with routine healing: Secondary | ICD-10-CM | POA: Diagnosis not present

## 2023-08-08 DIAGNOSIS — I1310 Hypertensive heart and chronic kidney disease without heart failure, with stage 1 through stage 4 chronic kidney disease, or unspecified chronic kidney disease: Secondary | ICD-10-CM | POA: Diagnosis not present

## 2023-08-08 DIAGNOSIS — D649 Anemia, unspecified: Secondary | ICD-10-CM | POA: Diagnosis not present

## 2023-08-08 DIAGNOSIS — K219 Gastro-esophageal reflux disease without esophagitis: Secondary | ICD-10-CM | POA: Diagnosis not present

## 2023-08-08 DIAGNOSIS — I739 Peripheral vascular disease, unspecified: Secondary | ICD-10-CM | POA: Diagnosis not present

## 2023-08-08 DIAGNOSIS — M2550 Pain in unspecified joint: Secondary | ICD-10-CM | POA: Diagnosis not present

## 2023-08-08 DIAGNOSIS — J449 Chronic obstructive pulmonary disease, unspecified: Secondary | ICD-10-CM | POA: Diagnosis not present

## 2023-08-11 DIAGNOSIS — M810 Age-related osteoporosis without current pathological fracture: Secondary | ICD-10-CM | POA: Diagnosis not present

## 2023-08-11 DIAGNOSIS — Z8781 Personal history of (healed) traumatic fracture: Secondary | ICD-10-CM | POA: Diagnosis not present

## 2023-08-11 DIAGNOSIS — Z9181 History of falling: Secondary | ICD-10-CM | POA: Diagnosis not present

## 2023-08-11 DIAGNOSIS — Z5181 Encounter for therapeutic drug level monitoring: Secondary | ICD-10-CM | POA: Diagnosis not present

## 2023-08-11 DIAGNOSIS — Z87310 Personal history of (healed) osteoporosis fracture: Secondary | ICD-10-CM | POA: Diagnosis not present

## 2023-08-11 DIAGNOSIS — J449 Chronic obstructive pulmonary disease, unspecified: Secondary | ICD-10-CM | POA: Diagnosis not present

## 2023-09-02 DIAGNOSIS — S81801A Unspecified open wound, right lower leg, initial encounter: Secondary | ICD-10-CM | POA: Diagnosis not present

## 2023-09-07 DIAGNOSIS — D649 Anemia, unspecified: Secondary | ICD-10-CM | POA: Diagnosis not present

## 2023-09-07 DIAGNOSIS — I1310 Hypertensive heart and chronic kidney disease without heart failure, with stage 1 through stage 4 chronic kidney disease, or unspecified chronic kidney disease: Secondary | ICD-10-CM | POA: Diagnosis not present

## 2023-09-07 DIAGNOSIS — J449 Chronic obstructive pulmonary disease, unspecified: Secondary | ICD-10-CM | POA: Diagnosis not present

## 2023-09-07 DIAGNOSIS — M810 Age-related osteoporosis without current pathological fracture: Secondary | ICD-10-CM | POA: Diagnosis not present

## 2023-09-07 DIAGNOSIS — I119 Hypertensive heart disease without heart failure: Secondary | ICD-10-CM | POA: Diagnosis not present

## 2023-09-09 DIAGNOSIS — S81801A Unspecified open wound, right lower leg, initial encounter: Secondary | ICD-10-CM | POA: Diagnosis not present

## 2023-09-10 DIAGNOSIS — M810 Age-related osteoporosis without current pathological fracture: Secondary | ICD-10-CM | POA: Diagnosis not present

## 2023-09-16 DIAGNOSIS — S81801A Unspecified open wound, right lower leg, initial encounter: Secondary | ICD-10-CM | POA: Diagnosis not present

## 2023-10-06 DIAGNOSIS — I10 Essential (primary) hypertension: Secondary | ICD-10-CM | POA: Diagnosis not present

## 2023-10-12 DIAGNOSIS — M2550 Pain in unspecified joint: Secondary | ICD-10-CM | POA: Diagnosis not present

## 2023-10-12 DIAGNOSIS — I1310 Hypertensive heart and chronic kidney disease without heart failure, with stage 1 through stage 4 chronic kidney disease, or unspecified chronic kidney disease: Secondary | ICD-10-CM | POA: Diagnosis not present

## 2023-10-12 DIAGNOSIS — M810 Age-related osteoporosis without current pathological fracture: Secondary | ICD-10-CM | POA: Diagnosis not present

## 2023-10-12 DIAGNOSIS — J449 Chronic obstructive pulmonary disease, unspecified: Secondary | ICD-10-CM | POA: Diagnosis not present

## 2023-10-13 DIAGNOSIS — I1310 Hypertensive heart and chronic kidney disease without heart failure, with stage 1 through stage 4 chronic kidney disease, or unspecified chronic kidney disease: Secondary | ICD-10-CM | POA: Diagnosis not present

## 2023-10-13 DIAGNOSIS — L03115 Cellulitis of right lower limb: Secondary | ICD-10-CM | POA: Diagnosis not present

## 2023-10-13 DIAGNOSIS — J449 Chronic obstructive pulmonary disease, unspecified: Secondary | ICD-10-CM | POA: Diagnosis not present

## 2023-10-13 DIAGNOSIS — M810 Age-related osteoporosis without current pathological fracture: Secondary | ICD-10-CM | POA: Diagnosis not present

## 2023-10-13 DIAGNOSIS — M1711 Unilateral primary osteoarthritis, right knee: Secondary | ICD-10-CM | POA: Diagnosis not present

## 2023-10-13 DIAGNOSIS — R2241 Localized swelling, mass and lump, right lower limb: Secondary | ICD-10-CM | POA: Diagnosis not present

## 2023-10-13 DIAGNOSIS — M2550 Pain in unspecified joint: Secondary | ICD-10-CM | POA: Diagnosis not present

## 2023-10-14 DIAGNOSIS — M810 Age-related osteoporosis without current pathological fracture: Secondary | ICD-10-CM | POA: Diagnosis not present

## 2023-10-16 DIAGNOSIS — M1712 Unilateral primary osteoarthritis, left knee: Secondary | ICD-10-CM | POA: Diagnosis not present

## 2023-10-18 DIAGNOSIS — N390 Urinary tract infection, site not specified: Secondary | ICD-10-CM | POA: Diagnosis not present

## 2023-10-18 DIAGNOSIS — J9601 Acute respiratory failure with hypoxia: Secondary | ICD-10-CM | POA: Diagnosis not present

## 2023-10-18 DIAGNOSIS — Z880 Allergy status to penicillin: Secondary | ICD-10-CM | POA: Diagnosis not present

## 2023-10-18 DIAGNOSIS — R0902 Hypoxemia: Secondary | ICD-10-CM | POA: Diagnosis not present

## 2023-10-18 DIAGNOSIS — R000 Tachycardia, unspecified: Secondary | ICD-10-CM | POA: Diagnosis not present

## 2023-10-18 DIAGNOSIS — J690 Pneumonitis due to inhalation of food and vomit: Secondary | ICD-10-CM | POA: Diagnosis not present

## 2023-10-18 DIAGNOSIS — G9341 Metabolic encephalopathy: Secondary | ICD-10-CM | POA: Diagnosis not present

## 2023-10-18 DIAGNOSIS — E872 Acidosis, unspecified: Secondary | ICD-10-CM | POA: Diagnosis not present

## 2023-10-18 DIAGNOSIS — M1990 Unspecified osteoarthritis, unspecified site: Secondary | ICD-10-CM | POA: Diagnosis not present

## 2023-10-18 DIAGNOSIS — D649 Anemia, unspecified: Secondary | ICD-10-CM | POA: Diagnosis not present

## 2023-10-18 DIAGNOSIS — E785 Hyperlipidemia, unspecified: Secondary | ICD-10-CM | POA: Diagnosis not present

## 2023-10-18 DIAGNOSIS — A419 Sepsis, unspecified organism: Secondary | ICD-10-CM | POA: Diagnosis not present

## 2023-10-18 DIAGNOSIS — M810 Age-related osteoporosis without current pathological fracture: Secondary | ICD-10-CM | POA: Diagnosis not present

## 2023-10-18 DIAGNOSIS — Z993 Dependence on wheelchair: Secondary | ICD-10-CM | POA: Diagnosis not present

## 2023-10-18 DIAGNOSIS — I959 Hypotension, unspecified: Secondary | ICD-10-CM | POA: Diagnosis not present

## 2023-10-18 DIAGNOSIS — R509 Fever, unspecified: Secondary | ICD-10-CM | POA: Diagnosis not present

## 2023-10-18 DIAGNOSIS — G8929 Other chronic pain: Secondary | ICD-10-CM | POA: Diagnosis not present

## 2023-10-18 DIAGNOSIS — Z515 Encounter for palliative care: Secondary | ICD-10-CM | POA: Diagnosis not present

## 2023-10-18 DIAGNOSIS — R6521 Severe sepsis with septic shock: Secondary | ICD-10-CM | POA: Diagnosis not present

## 2023-10-18 DIAGNOSIS — R410 Disorientation, unspecified: Secondary | ICD-10-CM | POA: Diagnosis not present

## 2023-10-18 DIAGNOSIS — I10 Essential (primary) hypertension: Secondary | ICD-10-CM | POA: Diagnosis not present

## 2023-10-18 DIAGNOSIS — N179 Acute kidney failure, unspecified: Secondary | ICD-10-CM | POA: Diagnosis not present

## 2023-10-18 DIAGNOSIS — E871 Hypo-osmolality and hyponatremia: Secondary | ICD-10-CM | POA: Diagnosis not present

## 2023-10-18 DIAGNOSIS — R531 Weakness: Secondary | ICD-10-CM | POA: Diagnosis not present

## 2023-10-18 DIAGNOSIS — J4530 Mild persistent asthma, uncomplicated: Secondary | ICD-10-CM | POA: Diagnosis not present

## 2023-10-18 DIAGNOSIS — K219 Gastro-esophageal reflux disease without esophagitis: Secondary | ICD-10-CM | POA: Diagnosis not present

## 2023-10-18 DIAGNOSIS — R9431 Abnormal electrocardiogram [ECG] [EKG]: Secondary | ICD-10-CM | POA: Diagnosis not present

## 2023-10-18 DIAGNOSIS — Z7401 Bed confinement status: Secondary | ICD-10-CM | POA: Diagnosis not present

## 2023-10-18 DIAGNOSIS — E119 Type 2 diabetes mellitus without complications: Secondary | ICD-10-CM | POA: Diagnosis not present

## 2023-10-18 DIAGNOSIS — E875 Hyperkalemia: Secondary | ICD-10-CM | POA: Diagnosis not present

## 2023-10-28 DIAGNOSIS — Z7401 Bed confinement status: Secondary | ICD-10-CM | POA: Diagnosis not present

## 2023-10-28 DIAGNOSIS — R531 Weakness: Secondary | ICD-10-CM | POA: Diagnosis not present

## 2023-11-01 DIAGNOSIS — R0902 Hypoxemia: Secondary | ICD-10-CM | POA: Diagnosis not present

## 2023-11-01 DIAGNOSIS — R410 Disorientation, unspecified: Secondary | ICD-10-CM | POA: Diagnosis not present

## 2023-11-01 DIAGNOSIS — I447 Left bundle-branch block, unspecified: Secondary | ICD-10-CM | POA: Diagnosis not present

## 2023-11-01 DIAGNOSIS — Z7401 Bed confinement status: Secondary | ICD-10-CM | POA: Diagnosis not present

## 2023-11-01 DIAGNOSIS — Z743 Need for continuous supervision: Secondary | ICD-10-CM | POA: Diagnosis not present

## 2023-11-01 DIAGNOSIS — R404 Transient alteration of awareness: Secondary | ICD-10-CM | POA: Diagnosis not present

## 2023-11-07 DIAGNOSIS — M25559 Pain in unspecified hip: Secondary | ICD-10-CM | POA: Diagnosis not present

## 2023-11-07 DIAGNOSIS — M25561 Pain in right knee: Secondary | ICD-10-CM | POA: Diagnosis not present

## 2023-11-07 DIAGNOSIS — M25562 Pain in left knee: Secondary | ICD-10-CM | POA: Diagnosis not present

## 2023-12-30 DIAGNOSIS — N186 End stage renal disease: Secondary | ICD-10-CM | POA: Diagnosis not present

## 2024-01-01 DIAGNOSIS — H2513 Age-related nuclear cataract, bilateral: Secondary | ICD-10-CM | POA: Diagnosis not present

## 2024-01-01 DIAGNOSIS — Z7951 Long term (current) use of inhaled steroids: Secondary | ICD-10-CM | POA: Diagnosis not present

## 2024-01-01 DIAGNOSIS — Z7952 Long term (current) use of systemic steroids: Secondary | ICD-10-CM | POA: Diagnosis not present

## 2024-02-10 DIAGNOSIS — Z23 Encounter for immunization: Secondary | ICD-10-CM | POA: Diagnosis not present

## 2024-02-15 DIAGNOSIS — L03032 Cellulitis of left toe: Secondary | ICD-10-CM | POA: Diagnosis not present

## 2024-02-15 DIAGNOSIS — L600 Ingrowing nail: Secondary | ICD-10-CM | POA: Diagnosis not present
